# Patient Record
Sex: Female | Born: 1945 | Race: White | Hispanic: No | Marital: Married | Smoking: Never smoker
Health system: Southern US, Community
[De-identification: ages and names within clinical notes are randomized; demographics above are authoritative.]

## PROBLEM LIST (undated history)

## (undated) DIAGNOSIS — E785 Hyperlipidemia, unspecified: Secondary | ICD-10-CM

## (undated) DIAGNOSIS — M8580 Other specified disorders of bone density and structure, unspecified site: Secondary | ICD-10-CM

## (undated) DIAGNOSIS — C50919 Malignant neoplasm of unspecified site of unspecified female breast: Secondary | ICD-10-CM

## (undated) DIAGNOSIS — M1990 Unspecified osteoarthritis, unspecified site: Secondary | ICD-10-CM

## (undated) DIAGNOSIS — N952 Postmenopausal atrophic vaginitis: Secondary | ICD-10-CM

## (undated) DIAGNOSIS — E559 Vitamin D deficiency, unspecified: Secondary | ICD-10-CM

## (undated) DIAGNOSIS — S62109A Fracture of unspecified carpal bone, unspecified wrist, initial encounter for closed fracture: Secondary | ICD-10-CM

## (undated) HISTORY — PX: PILONIDAL CYST EXCISION: SHX744

## (undated) HISTORY — PX: WISDOM TOOTH EXTRACTION: SHX21

## (undated) HISTORY — DX: Vitamin D deficiency, unspecified: E55.9

## (undated) HISTORY — DX: Hyperlipidemia, unspecified: E78.5

## (undated) HISTORY — PX: TONSILLECTOMY: SUR1361

## (undated) HISTORY — DX: Fracture of unspecified carpal bone, unspecified wrist, initial encounter for closed fracture: S62.109A

## (undated) HISTORY — PX: CATARACT EXTRACTION, BILATERAL: SHX1313

## (undated) HISTORY — DX: Postmenopausal atrophic vaginitis: N95.2

## (undated) HISTORY — DX: Malignant neoplasm of unspecified site of unspecified female breast: C50.919

## (undated) HISTORY — DX: Other specified disorders of bone density and structure, unspecified site: M85.80

---

## 1993-01-11 HISTORY — PX: BREAST CYST ASPIRATION: SHX578

## 1997-08-01 ENCOUNTER — Other Ambulatory Visit: Admission: RE | Admit: 1997-08-01 | Discharge: 1997-08-01 | Payer: Self-pay | Admitting: Obstetrics and Gynecology

## 1998-01-11 HISTORY — PX: BREAST BIOPSY: SHX20

## 1998-03-31 ENCOUNTER — Other Ambulatory Visit: Admission: RE | Admit: 1998-03-31 | Discharge: 1998-03-31 | Payer: Self-pay | Admitting: Radiology

## 1998-10-06 ENCOUNTER — Other Ambulatory Visit: Admission: RE | Admit: 1998-10-06 | Discharge: 1998-10-06 | Payer: Self-pay | Admitting: Obstetrics and Gynecology

## 1999-10-12 ENCOUNTER — Other Ambulatory Visit: Admission: RE | Admit: 1999-10-12 | Discharge: 1999-10-12 | Payer: Self-pay | Admitting: Obstetrics and Gynecology

## 2000-12-02 ENCOUNTER — Other Ambulatory Visit: Admission: RE | Admit: 2000-12-02 | Discharge: 2000-12-02 | Payer: Self-pay | Admitting: Obstetrics and Gynecology

## 2001-12-11 ENCOUNTER — Other Ambulatory Visit: Admission: RE | Admit: 2001-12-11 | Discharge: 2001-12-11 | Payer: Self-pay | Admitting: Obstetrics and Gynecology

## 2002-04-09 ENCOUNTER — Ambulatory Visit (HOSPITAL_COMMUNITY): Admission: RE | Admit: 2002-04-09 | Discharge: 2002-04-09 | Payer: Self-pay | Admitting: Gastroenterology

## 2003-03-04 ENCOUNTER — Other Ambulatory Visit: Admission: RE | Admit: 2003-03-04 | Discharge: 2003-03-04 | Payer: Self-pay | Admitting: Obstetrics and Gynecology

## 2003-07-10 ENCOUNTER — Encounter (HOSPITAL_COMMUNITY): Admission: RE | Admit: 2003-07-10 | Discharge: 2003-10-08 | Payer: Self-pay | Admitting: General Surgery

## 2003-07-12 ENCOUNTER — Encounter: Admission: RE | Admit: 2003-07-12 | Discharge: 2003-07-12 | Payer: Self-pay | Admitting: General Surgery

## 2003-07-16 HISTORY — PX: BREAST LUMPECTOMY: SHX2

## 2003-07-16 HISTORY — PX: SENTINEL LYMPH NODE BIOPSY: SHX2392

## 2003-07-17 ENCOUNTER — Ambulatory Visit (HOSPITAL_BASED_OUTPATIENT_CLINIC_OR_DEPARTMENT_OTHER): Admission: RE | Admit: 2003-07-17 | Discharge: 2003-07-17 | Payer: Self-pay | Admitting: General Surgery

## 2003-07-17 ENCOUNTER — Ambulatory Visit (HOSPITAL_COMMUNITY): Admission: RE | Admit: 2003-07-17 | Discharge: 2003-07-17 | Payer: Self-pay | Admitting: General Surgery

## 2003-07-17 ENCOUNTER — Encounter (INDEPENDENT_AMBULATORY_CARE_PROVIDER_SITE_OTHER): Payer: Self-pay | Admitting: Specialist

## 2003-08-12 ENCOUNTER — Ambulatory Visit: Admission: RE | Admit: 2003-08-12 | Discharge: 2003-09-25 | Payer: Self-pay | Admitting: *Deleted

## 2003-08-14 ENCOUNTER — Ambulatory Visit (HOSPITAL_COMMUNITY): Admission: RE | Admit: 2003-08-14 | Discharge: 2003-08-14 | Payer: Self-pay | Admitting: Oncology

## 2003-11-22 ENCOUNTER — Ambulatory Visit: Payer: Self-pay | Admitting: Oncology

## 2004-01-03 ENCOUNTER — Ambulatory Visit: Admission: RE | Admit: 2004-01-03 | Discharge: 2004-03-06 | Payer: Self-pay | Admitting: *Deleted

## 2004-01-17 ENCOUNTER — Ambulatory Visit: Payer: Self-pay | Admitting: Oncology

## 2004-03-12 ENCOUNTER — Ambulatory Visit: Payer: Self-pay | Admitting: Oncology

## 2004-03-17 ENCOUNTER — Other Ambulatory Visit: Admission: RE | Admit: 2004-03-17 | Discharge: 2004-03-17 | Payer: Self-pay | Admitting: Obstetrics and Gynecology

## 2004-04-01 ENCOUNTER — Ambulatory Visit: Admission: RE | Admit: 2004-04-01 | Discharge: 2004-04-01 | Payer: Self-pay | Admitting: *Deleted

## 2004-07-16 ENCOUNTER — Ambulatory Visit: Payer: Self-pay | Admitting: Oncology

## 2004-11-03 ENCOUNTER — Ambulatory Visit: Payer: Self-pay | Admitting: Oncology

## 2005-01-13 ENCOUNTER — Ambulatory Visit: Payer: Self-pay | Admitting: Oncology

## 2005-04-06 ENCOUNTER — Other Ambulatory Visit: Admission: RE | Admit: 2005-04-06 | Discharge: 2005-04-06 | Payer: Self-pay | Admitting: Obstetrics & Gynecology

## 2005-06-21 ENCOUNTER — Ambulatory Visit: Payer: Self-pay | Admitting: Internal Medicine

## 2005-07-20 ENCOUNTER — Ambulatory Visit: Payer: Self-pay | Admitting: Oncology

## 2005-07-20 LAB — CBC WITH DIFFERENTIAL/PLATELET
BASO%: 1.3 % (ref 0.0–2.0)
EOS%: 1.5 % (ref 0.0–7.0)
HCT: 37.9 % (ref 34.8–46.6)
HGB: 13.1 g/dL (ref 11.6–15.9)
MCH: 31.8 pg (ref 26.0–34.0)
MCHC: 34.7 g/dL (ref 32.0–36.0)
MCV: 91.7 fL (ref 81.0–101.0)
MONO#: 0.4 10*3/uL (ref 0.1–0.9)
NEUT#: 2.1 10*3/uL (ref 1.5–6.5)
RDW: 13.2 % (ref 11.3–14.5)

## 2005-07-20 LAB — LACTATE DEHYDROGENASE: LDH: 138 U/L (ref 94–250)

## 2005-07-20 LAB — COMPREHENSIVE METABOLIC PANEL
Albumin: 4.7 g/dL (ref 3.5–5.2)
Alkaline Phosphatase: 56 U/L (ref 39–117)
BUN: 15 mg/dL (ref 6–23)
CO2: 27 mEq/L (ref 19–32)
Chloride: 104 mEq/L (ref 96–112)
Glucose, Bld: 91 mg/dL (ref 70–99)
Total Protein: 7.1 g/dL (ref 6.0–8.3)

## 2006-01-19 ENCOUNTER — Ambulatory Visit: Payer: Self-pay | Admitting: Oncology

## 2006-01-24 LAB — COMPREHENSIVE METABOLIC PANEL
ALT: 14 U/L (ref 0–35)
AST: 17 U/L (ref 0–37)
Albumin: 4.8 g/dL (ref 3.5–5.2)
Alkaline Phosphatase: 50 U/L (ref 39–117)
BUN: 16 mg/dL (ref 6–23)
Calcium: 9.7 mg/dL (ref 8.4–10.5)
Chloride: 107 mEq/L (ref 96–112)
Potassium: 4 mEq/L (ref 3.5–5.3)

## 2006-01-24 LAB — CBC WITH DIFFERENTIAL/PLATELET
BASO%: 0.8 % (ref 0.0–2.0)
EOS%: 0.7 % (ref 0.0–7.0)
MCH: 31.3 pg (ref 26.0–34.0)
MCHC: 33.5 g/dL (ref 32.0–36.0)
MCV: 93.2 fL (ref 81.0–101.0)
MONO%: 6.6 % (ref 0.0–13.0)
RBC: 3.84 10*6/uL (ref 3.70–5.32)
RDW: 13.1 % (ref 11.3–14.5)
lymph#: 2.5 10*3/uL (ref 0.9–3.3)

## 2006-04-18 ENCOUNTER — Other Ambulatory Visit: Admission: RE | Admit: 2006-04-18 | Discharge: 2006-04-18 | Payer: Self-pay | Admitting: Obstetrics and Gynecology

## 2006-06-30 ENCOUNTER — Ambulatory Visit (HOSPITAL_COMMUNITY): Admission: RE | Admit: 2006-06-30 | Discharge: 2006-06-30 | Payer: Self-pay | Admitting: Diagnostic Radiology

## 2006-07-04 ENCOUNTER — Ambulatory Visit (HOSPITAL_COMMUNITY): Admission: RE | Admit: 2006-07-04 | Discharge: 2006-07-04 | Payer: Self-pay | Admitting: Oncology

## 2006-07-11 ENCOUNTER — Ambulatory Visit: Payer: Self-pay | Admitting: Internal Medicine

## 2006-07-18 ENCOUNTER — Ambulatory Visit: Payer: Self-pay | Admitting: Oncology

## 2006-07-21 LAB — CBC WITH DIFFERENTIAL/PLATELET
Basophils Absolute: 0 10*3/uL (ref 0.0–0.1)
Eosinophils Absolute: 0 10*3/uL (ref 0.0–0.5)
HGB: 11.7 g/dL (ref 11.6–15.9)
MCV: 91.1 fL (ref 81.0–101.0)
MONO#: 0.3 10*3/uL (ref 0.1–0.9)
MONO%: 5.5 % (ref 0.0–13.0)
NEUT#: 2.3 10*3/uL (ref 1.5–6.5)
RDW: 13.3 % (ref 11.3–14.5)
WBC: 4.8 10*3/uL (ref 3.9–10.0)

## 2006-07-21 LAB — COMPREHENSIVE METABOLIC PANEL
Albumin: 4.5 g/dL (ref 3.5–5.2)
Alkaline Phosphatase: 40 U/L (ref 39–117)
BUN: 13 mg/dL (ref 6–23)
CO2: 25 mEq/L (ref 19–32)
Calcium: 9.2 mg/dL (ref 8.4–10.5)
Chloride: 108 mEq/L (ref 96–112)
Glucose, Bld: 163 mg/dL — ABNORMAL HIGH (ref 70–99)
Potassium: 3.7 mEq/L (ref 3.5–5.3)
Sodium: 140 mEq/L (ref 135–145)
Total Protein: 6.8 g/dL (ref 6.0–8.3)

## 2006-07-21 LAB — CANCER ANTIGEN 27.29: CA 27.29: 19 U/mL (ref 0–39)

## 2006-07-21 LAB — LACTATE DEHYDROGENASE: LDH: 135 U/L (ref 94–250)

## 2006-09-06 DIAGNOSIS — Z853 Personal history of malignant neoplasm of breast: Secondary | ICD-10-CM

## 2006-10-14 ENCOUNTER — Telehealth (INDEPENDENT_AMBULATORY_CARE_PROVIDER_SITE_OTHER): Payer: Self-pay | Admitting: *Deleted

## 2006-11-18 ENCOUNTER — Ambulatory Visit: Payer: Self-pay | Admitting: Internal Medicine

## 2007-01-23 ENCOUNTER — Ambulatory Visit: Payer: Self-pay | Admitting: Oncology

## 2007-01-25 LAB — COMPREHENSIVE METABOLIC PANEL
ALT: 13 U/L (ref 0–35)
AST: 17 U/L (ref 0–37)
CO2: 26 mEq/L (ref 19–32)
Calcium: 9.8 mg/dL (ref 8.4–10.5)
Chloride: 107 mEq/L (ref 96–112)
Creatinine, Ser: 0.79 mg/dL (ref 0.40–1.20)
Sodium: 143 mEq/L (ref 135–145)
Total Bilirubin: 0.4 mg/dL (ref 0.3–1.2)
Total Protein: 7.2 g/dL (ref 6.0–8.3)

## 2007-01-25 LAB — CBC WITH DIFFERENTIAL/PLATELET
BASO%: 1.2 % (ref 0.0–2.0)
EOS%: 1.1 % (ref 0.0–7.0)
MCH: 31.9 pg (ref 26.0–34.0)
MCHC: 35.2 g/dL (ref 32.0–36.0)
MONO#: 0.3 10*3/uL (ref 0.1–0.9)
NEUT%: 43.1 % (ref 39.6–76.8)
RBC: 4.13 10*6/uL (ref 3.70–5.32)
WBC: 4.9 10*3/uL (ref 3.9–10.0)
lymph#: 2.4 10*3/uL (ref 0.9–3.3)

## 2007-01-25 LAB — LACTATE DEHYDROGENASE: LDH: 135 U/L (ref 94–250)

## 2007-05-08 ENCOUNTER — Other Ambulatory Visit: Admission: RE | Admit: 2007-05-08 | Discharge: 2007-05-08 | Payer: Self-pay | Admitting: Obstetrics & Gynecology

## 2007-06-28 ENCOUNTER — Encounter: Payer: Self-pay | Admitting: Internal Medicine

## 2007-08-14 ENCOUNTER — Telehealth: Payer: Self-pay | Admitting: *Deleted

## 2007-10-19 ENCOUNTER — Ambulatory Visit: Payer: Self-pay | Admitting: Internal Medicine

## 2008-01-09 ENCOUNTER — Ambulatory Visit: Payer: Self-pay | Admitting: Internal Medicine

## 2008-01-09 DIAGNOSIS — S99929A Unspecified injury of unspecified foot, initial encounter: Secondary | ICD-10-CM

## 2008-01-09 DIAGNOSIS — S8990XA Unspecified injury of unspecified lower leg, initial encounter: Secondary | ICD-10-CM

## 2008-01-09 DIAGNOSIS — S2341XA Sprain of ribs, initial encounter: Secondary | ICD-10-CM

## 2008-01-09 DIAGNOSIS — S99919A Unspecified injury of unspecified ankle, initial encounter: Secondary | ICD-10-CM

## 2008-01-23 ENCOUNTER — Ambulatory Visit: Payer: Self-pay | Admitting: Oncology

## 2008-01-25 LAB — CBC WITH DIFFERENTIAL/PLATELET
Basophils Absolute: 0 10*3/uL (ref 0.0–0.1)
EOS%: 0.8 % (ref 0.0–7.0)
HCT: 37.7 % (ref 34.8–46.6)
HGB: 13 g/dL (ref 11.6–15.9)
MONO%: 6.5 % (ref 0.0–13.0)
NEUT#: 2.3 10*3/uL (ref 1.5–6.5)
NEUT%: 48.7 % (ref 39.6–76.8)
Platelets: 207 10*3/uL (ref 145–400)

## 2008-01-26 LAB — COMPREHENSIVE METABOLIC PANEL
ALT: 13 U/L (ref 0–35)
AST: 14 U/L (ref 0–37)
Albumin: 4.6 g/dL (ref 3.5–5.2)
Alkaline Phosphatase: 58 U/L (ref 39–117)
BUN: 14 mg/dL (ref 6–23)
Calcium: 9.3 mg/dL (ref 8.4–10.5)
Total Bilirubin: 0.5 mg/dL (ref 0.3–1.2)

## 2008-01-26 LAB — LACTATE DEHYDROGENASE: LDH: 130 U/L (ref 94–250)

## 2008-03-28 ENCOUNTER — Encounter: Payer: Self-pay | Admitting: Internal Medicine

## 2008-05-06 ENCOUNTER — Encounter: Payer: Self-pay | Admitting: Internal Medicine

## 2008-07-01 ENCOUNTER — Encounter: Payer: Self-pay | Admitting: Internal Medicine

## 2008-07-02 ENCOUNTER — Ambulatory Visit: Payer: Self-pay | Admitting: Oncology

## 2008-11-25 ENCOUNTER — Ambulatory Visit: Payer: Self-pay | Admitting: Internal Medicine

## 2009-02-03 ENCOUNTER — Ambulatory Visit: Payer: Self-pay | Admitting: Oncology

## 2009-02-05 LAB — CBC WITH DIFFERENTIAL/PLATELET
Basophils Absolute: 0.1 10*3/uL (ref 0.0–0.1)
EOS%: 0.8 % (ref 0.0–7.0)
Eosinophils Absolute: 0.1 10*3/uL (ref 0.0–0.5)
HCT: 38.2 % (ref 34.8–46.6)
MONO#: 0.3 10*3/uL (ref 0.1–0.9)
MONO%: 4.3 % (ref 0.0–14.0)
NEUT#: 4.6 10*3/uL (ref 1.5–6.5)
NEUT%: 58.9 % (ref 38.4–76.8)
Platelets: 262 10*3/uL (ref 145–400)
RBC: 4.08 10*6/uL (ref 3.70–5.45)
WBC: 7.8 10*3/uL (ref 3.9–10.3)

## 2009-02-05 LAB — COMPREHENSIVE METABOLIC PANEL
AST: 18 U/L (ref 0–37)
Albumin: 4.9 g/dL (ref 3.5–5.2)
Alkaline Phosphatase: 50 U/L (ref 39–117)
CO2: 27 mEq/L (ref 19–32)
Glucose, Bld: 146 mg/dL — ABNORMAL HIGH (ref 70–99)
Total Bilirubin: 0.3 mg/dL (ref 0.3–1.2)

## 2009-09-23 LAB — HM PAP SMEAR

## 2009-10-01 ENCOUNTER — Ambulatory Visit: Payer: Self-pay | Admitting: Internal Medicine

## 2010-02-02 ENCOUNTER — Encounter: Payer: Self-pay | Admitting: Oncology

## 2010-02-12 NOTE — Assessment & Plan Note (Signed)
Summary: flu shot//ccm  Nurse Visit   Vitals Entered By: Duard Brady LPN (October 01, 2009 4:08 PM)  Allergies: No Known Drug Allergies  Orders Added: 1)  Admin 1st Vaccine [90471] 2)  Flu Vaccine 66yrs + [04540] Flu Vaccine Consent Questions     Do you have a history of severe allergic reactions to this vaccine? no    Any prior history of allergic reactions to egg and/or gelatin? no    Do you have a sensitivity to the preservative Thimersol? no    Do you have a past history of Guillan-Barre Syndrome? no    Do you currently have an acute febrile illness? no    Have you ever had a severe reaction to latex? no    Vaccine information given and explained to patient? yes    Are you currently pregnant? no    Lot Number:[id number]   Exp Date:07/11/2010   Site Given  Left Deltoid IM .lbflu

## 2010-03-19 ENCOUNTER — Encounter (HOSPITAL_BASED_OUTPATIENT_CLINIC_OR_DEPARTMENT_OTHER): Payer: BC Managed Care – PPO | Admitting: Oncology

## 2010-03-19 ENCOUNTER — Other Ambulatory Visit: Payer: Self-pay | Admitting: Oncology

## 2010-03-19 DIAGNOSIS — C50319 Malignant neoplasm of lower-inner quadrant of unspecified female breast: Secondary | ICD-10-CM

## 2010-03-19 DIAGNOSIS — Z170 Estrogen receptor positive status [ER+]: Secondary | ICD-10-CM

## 2010-03-19 DIAGNOSIS — M899 Disorder of bone, unspecified: Secondary | ICD-10-CM

## 2010-03-19 LAB — CBC WITH DIFFERENTIAL/PLATELET
BASO%: 1.1 % (ref 0.0–2.0)
HGB: 12.6 g/dL (ref 11.6–15.9)
MCH: 30.7 pg (ref 25.1–34.0)
MCV: 90 fL (ref 79.5–101.0)
MONO#: 0.3 10*3/uL (ref 0.1–0.9)
Platelets: 175 10*3/uL (ref 145–400)
RBC: 4.1 10*6/uL (ref 3.70–5.45)

## 2010-03-20 LAB — CANCER ANTIGEN 27.29: CA 27.29: 29 U/mL (ref 0–39)

## 2010-03-20 LAB — COMPREHENSIVE METABOLIC PANEL
ALT: 15 U/L (ref 0–35)
AST: 20 U/L (ref 0–37)
BUN: 13 mg/dL (ref 6–23)
CO2: 24 mEq/L (ref 19–32)
Chloride: 103 mEq/L (ref 96–112)
Glucose, Bld: 121 mg/dL — ABNORMAL HIGH (ref 70–99)
Potassium: 4.4 mEq/L (ref 3.5–5.3)
Sodium: 138 mEq/L (ref 135–145)

## 2010-03-20 LAB — LACTATE DEHYDROGENASE: LDH: 129 U/L (ref 94–250)

## 2010-05-29 NOTE — Op Note (Signed)
NAME:  Diana Martin, Diana Martin                        ACCOUNT NO.:  [id number]   MEDICAL RECORD NO.:  [id number]                   PATIENT TYPE:  AMB   LOCATION:  DSC                                  FACILITY:  [facility]   PHYSICIAN:  Sharlet Salina T. Hoxworth, M.D.          DATE OF BIRTH:  08-Aug-1945   DATE OF PROCEDURE:  07/17/2003  DATE OF DISCHARGE:                                 OPERATIVE REPORT   PREOPERATIVE DIAGNOSIS:  Carcinoma of the left breast.   POSTOPERATIVE DIAGNOSIS:  Carcinoma of the left breast.   PROCEDURE:  1. Blue dye injection, left breast.  2. Left axillary sentinel lymph node biopsy.  3. Left breast needle localized lumpectomy.   SURGEON:  Lorne Skeens. Hoxworth, M.D.   ANESTHESIA:  LMA general.   BRIEF HISTORY:  [name] is a 65 year old white female who, on a recent  screening mammogram, was found to have a new 1 cm mass in the medial aspect  of the left breast.  Large core needle biopsy has revealed a well  differentiated ductal carcinoma with tubular features.  After a discussion  of the initial surgical treatment options, we have elected to proceed with  left axillary sentinel lymph node biopsy with possible axillary dissection  and needle localized left lumpectomy.  The nature of the procedure,  indications, risks of bleeding, infection, possible need for further surgery  based on the pathology findings were discussed and understood.  The patient  is now brought to the operating room for this procedure.   DESCRIPTION OF PROCEDURE:  The patient was brought to the operating room and  placed in supine position on the operating table and laryngeal mask general  anesthesia was induced.  She had previously undergone needle localization at  the [facility] and had undergone injection of 1 millicurie of  technetium sulfa colloid in nuclear medicine.  10 mL of Lymphazurin blue was  injected subareolarly and massaged for several minutes.  The left chest,  arm, and axilla were sterilely prepped and draped.  The Neoprobe was used to  identify a definite hot area in the left axilla.  A small transverse  incision was made and dissection was carried down to the subcutaneous  tissues with the cautery.  The axilla proper was entered with blunt  dissection and immediately identified was a bright blue lymph node.  This  was elevated up with a couple of other surrounding smaller nodes that had  some radioactive activity, as well, and this small block of nodes was  excised with cautery.  Counts of the sentinel node were in excess of 3000  with backgrounds in the axilla no greater than 250.  This was sent as  sentinel lymph node.  While awaiting this, we proceeded with the lumpectomy.  A curvilinear incision was made in the medial aspect of the breast over the  area of the tumor and dissection was carried down into the subcutaneous  tissue.  The wire was brought into the incision from its insertion site.  A  generous specimen of breast tissue was then excised around the shaft and the  tip of the wire working medially.  This was a thin area of the breast  medially but the specimen showed no abnormal tissue around its periphery and  a small firm area could be felt in the center of the specimen which appeared  to be well excised.  The deep margin included a portion of the pectoralis  fascia and the superficial margin was at the subcutaneous tissue.  The  specimen was oriented and sent for specimen mammography.  The sentinel node  report returned as negative for cancer.  The specimen mammography showed the  lesion well within the specimen.  Each wound was infiltrated with Marcaine  with epinephrine.  Hemostasis was obtained with cautery.  The wound was  closed with interrupted subcutaneous 3-0 Vicryl and running subcuticular 5-0  Monocryl and Steri-Strips.  Sponge, needle, and instrument counts were  correct.  Dry, sterile dressings were applied.  The  patient was taken to the  recovery room in good condition.                                               Lorne Skeens. Hoxworth, M.D.    Tory Emerald  D:  07/17/2003  T:  07/17/2003  Job:  [id number]

## 2010-05-29 NOTE — Op Note (Signed)
   NAME:  Diana Martin, Diana Martin                        ACCOUNT NO.:  [id number]   MEDICAL RECORD NO.:  [id number]                   PATIENT TYPE:  AMB   LOCATION:  ENDO                                 FACILITY:  [facility]   PHYSICIAN:  Anselmo Rod, M.D.               DATE OF BIRTH:  12-Jan-1945   DATE OF PROCEDURE:  04/09/2002  DATE OF DISCHARGE:                                 OPERATIVE REPORT   PROCEDURE:  Colonoscopy.   ENDOSCOPIST:  Charna Elizabeth, M.D.   INSTRUMENT USED:  Olympus video colonoscope.   INDICATIONS FOR PROCEDURE:  [age]-year-old white female with a history  of change in bowel habits and rectal bleeding.  Rule out colonic polyps,  masses, hemorrhoids, etc.   PROCEDURE PERFORMED:  Informed consent was procured from the patient.  The  patient fasted for eight hours prior to the procedure and prepped with a  bottle of magnesium citrate and a gallon of GoLYTELY the night prior to the  procedure.   PREPROCEDURE PHYSICAL EXAMINATION:  VITAL SIGNS:  The patient had stable  vital signs.  NECK:  Supple.  CHEST:  Clear to auscultation.  HEART:  S1 and S2 regular.  ABDOMEN:  Soft with normal bowel sounds.   DESCRIPTION OF PROCEDURE:  The patient was placed in the left lateral  decubitus position, sedated with 70 mg of Demerol and 7 mg of Versed  intravenously.  Once the patient was adequately sedated and maintained on  low flow oxygen, continuous cardiac monitoring, the Olympus video  colonoscope was advanced from the rectum to the cecum and terminal ileum  without difficulty. The patient had a fairly good prep.  Small internal  hemorrhoids were seen on retroflexion.  No masses, polyps, erosions, ulcers,  or diverticula.  On retroflexion, the rectum revealed no other abnormalities  except for the hemorrhoids mentioned above.   IMPRESSION:  Normal colonoscopy of the terminal ileum except for a small  nonbleeding internal hemorrhoid.   RECOMMENDATIONS:  1. High-fiber  diet with liberal fluid intake with __________  has been     recommended in the diet.  2.     Repeat colorectal screening is recommended in the next five years unless the     patient develops any abnormal symptoms in the interim.  3. Outpatient followup on a p.r.n. basis.                                               Anselmo Rod, M.D.    JNM/MEDQ  D:  04/09/2002  T:  04/09/2002  Job:  [id number]   cc:   Laqueta Linden, M.D.  [address]  [place]  [place] [address]  Fax: [phone]

## 2010-10-28 LAB — CREATININE, SERUM
Creatinine, Ser: 0.75
GFR calc Af Amer: >60
GFR calc non Af Amer: >60

## 2011-03-19 ENCOUNTER — Encounter: Payer: Self-pay | Admitting: Physician Assistant

## 2011-03-19 ENCOUNTER — Other Ambulatory Visit (HOSPITAL_BASED_OUTPATIENT_CLINIC_OR_DEPARTMENT_OTHER): Payer: Medicare Other | Admitting: Lab

## 2011-03-19 ENCOUNTER — Telehealth: Payer: Self-pay | Admitting: *Deleted

## 2011-03-19 ENCOUNTER — Ambulatory Visit (HOSPITAL_BASED_OUTPATIENT_CLINIC_OR_DEPARTMENT_OTHER): Payer: Medicare Other | Admitting: Physician Assistant

## 2011-03-19 DIAGNOSIS — C50319 Malignant neoplasm of lower-inner quadrant of unspecified female breast: Secondary | ICD-10-CM

## 2011-03-19 DIAGNOSIS — Z170 Estrogen receptor positive status [ER+]: Secondary | ICD-10-CM

## 2011-03-19 DIAGNOSIS — C50919 Malignant neoplasm of unspecified site of unspecified female breast: Secondary | ICD-10-CM

## 2011-03-19 DIAGNOSIS — E559 Vitamin D deficiency, unspecified: Secondary | ICD-10-CM

## 2011-03-19 DIAGNOSIS — M949 Disorder of cartilage, unspecified: Secondary | ICD-10-CM

## 2011-03-19 LAB — CBC WITH DIFFERENTIAL/PLATELET
Basophils Absolute: 0 10*3/uL (ref 0.0–0.1)
EOS%: 1.1 % (ref 0.0–7.0)
HCT: 37.5 % (ref 34.8–46.6)
HGB: 12.6 g/dL (ref 11.6–15.9)
MCH: 31.3 pg (ref 25.1–34.0)
NEUT%: 52.8 % (ref 38.4–76.8)
lymph#: 2.4 10*3/uL (ref 0.9–3.3)

## 2011-03-19 NOTE — Progress Notes (Signed)
Hematology and Oncology Follow Up Visit  Diana Martin [id number] 08-22-1945 66 y.o. 03/19/2011    HPI: Diana Martin is a 66 year old [place] woman with a history of a T1 C., N0 left breast carcinoma for which she underwent a left lumpectomy with sentinel node dissection for an ER/PR positive lesion. She completed 6 cycles of adjuvant CMF, followed by radiation therapy which was completed February of 2006. She then participated in MA27 trial and was randomized to the rib fracture arm. She completed 5 years worth of Arimidex in December 2010. On observation alone.  Interim History:   Diana Martin is seen today for routine annual follow pertain to her history of a stage I ER/PR positive left breast carcinoma. She is feeling great, volunteering on Wednesdays here at the [facility].  She denies any fevers, chills, night sweats, no shortness of breath or chest pain. No nausea, emesis, diarrhea, or constipation issues. She continues on Fosamax 35 mg weekly. She does weight bearing exercise. She will be due for her annual screening mammograms and biannual bone density both performed at [facility] in July 2013. A detailed review of systems is otherwise noncontributory as noted below.  Review of Systems: Constitutional:  no weight loss, fever, night sweats and feels well Eyes: uses glasses ENT: no complaints Cardiovascular: no chest pain or dyspnea on exertion Respiratory: no cough, shortness of breath, or wheezing Neurological: no TIA or stroke symptoms Dermatological: negative Gastrointestinal: no abdominal pain, change in bowel habits, or black or bloody stools Genito-Urinary: no dysuria, trouble voiding, or hematuria Hematological and Lymphatic: negative Breast: negative Musculoskeletal: negative Remaining ROS negative.  Medications:   I have reviewed the patient's current medications.  Current Outpatient Prescriptions  Medication Sig Dispense Refill  . alendronate (FOSAMAX) 35  MG tablet Take 35 mg by mouth every 7 (seven) days. Take with a full glass of water on an empty stomach.      . calcium-vitamin D (OSCAL WITH D) 500-200 MG-UNIT per tablet Take 1 tablet by mouth daily.      Marland Kitchen estradiol (VAGIFEM) 25 MCG vaginal tablet Place 25 mcg vaginally once a week.        Allergies: No Known Allergies  Physical Exam: Filed Vitals:   03/19/11 1315  BP: 129/74  Pulse: 82  Temp: 98.7 F (37.1 C)    Body mass index is 21.33 kg/(m^2). Weight: 128 lbs. HEENT:  Sclerae anicteric, conjunctivae pink.  Oropharynx clear.  No mucositis or candidiasis.   Nodes:  No cervical, supraclavicular, or axillary lymphadenopathy palpated.  Breast Exam: Her left breast was examined, telangiectasias are still present, the lumpectomy scar in the medial aspect is essentially benign no dominant mass effect was appreciated. No nipple inversion or discharge axilla is clear. The right breast is free of masses skin changes nipple inversion or discharge.  Lungs:  Clear to auscultation bilaterally.  No crackles, rhonchi, or wheezes.   Heart:  Regular rate and rhythm.   Abdomen:  Soft, nontender.  Positive bowel sounds.  No organomegaly or masses palpated.   Musculoskeletal:  No focal spinal tenderness to palpation.  Extremities:  Benign.  No peripheral edema or cyanosis.   Skin:  Benign.   Neuro:  Nonfocal, alert and oriented x 3.   Lab Results: Lab Results  Component Value Date   WBC 6.2 03/19/2011   HGB 12.6 03/19/2011   HCT 37.5 03/19/2011   MCV 93.1 03/19/2011   PLT 211 03/19/2011   NEUTROABS 3.3 03/19/2011  Chemistry      Component Value Date/Time   NA 138 03/19/2010 1015   NA 138 03/19/2010 1015   NA 138 03/19/2010 1015   K 4.4 03/19/2010 1015   K 4.4 03/19/2010 1015   K 4.4 03/19/2010 1015   CL 103 03/19/2010 1015   CL 103 03/19/2010 1015   CL 103 03/19/2010 1015   CO2 24 03/19/2010 1015   CO2 24 03/19/2010 1015   CO2 24 03/19/2010 1015   BUN 13 03/19/2010 1015   BUN 13 03/19/2010 1015   BUN 13 03/19/2010  1015   CREATININE 0.84 03/19/2010 1015   CREATININE 0.84 03/19/2010 1015   CREATININE 0.84 03/19/2010 1015      Component Value Date/Time   CALCIUM 9.2 03/19/2010 1015   CALCIUM 9.2 03/19/2010 1015   CALCIUM 9.2 03/19/2010 1015   ALKPHOS 43 03/19/2010 1015   ALKPHOS 43 03/19/2010 1015   ALKPHOS 43 03/19/2010 1015   AST 20 03/19/2010 1015   AST 20 03/19/2010 1015   AST 20 03/19/2010 1015   ALT 15 03/19/2010 1015   ALT 15 03/19/2010 1015   ALT 15 03/19/2010 1015   BILITOT 0.4 03/19/2010 1015   BILITOT 0.4 03/19/2010 1015   BILITOT 0.4 03/19/2010 1015      Lab Results  Component Value Date   LABCA2 29 03/19/2010   LABCA2 29 03/19/2010     Assessment:  Diana Martin is a 66 year old [place] woman with a history of a T1 C., N0 left breast carcinoma for which she underwent a left lumpectomy with sentinel node dissection for an ER/PR positive lesion. She completed 6 cycles of adjuvant CMF, followed by radiation therapy which was completed February of 2006. She then participated in MA27 trial and was randomized to the rib fracture arm. She completed 5 years worth of Arimidex in December 2010. On observation alone. No evidence of recurrent or metastatic disease.  Case has been reviewed with Dr. Pierce Crane.  Plan:  Diana Martin will be scheduled for her annual screening mammograms and biannual bone density in July of 2013, these will be performed at [facility]. We will see her back in one year for her annual followup to include a CBC, serum chemistry, LDH, vitamin D level, and a CA 27-29. She knows we would be happy to see her prior to her annual followup if the need should arise.  This plan was reviewed with the patient, who voices understanding and agreement.  She knows to call with any changes or problems.    Milas Schappell T, PA-C 03/19/2011

## 2011-03-19 NOTE — Telephone Encounter (Signed)
gave patient appointment for 03-2012 per patient request she will contact [facility] for her mammogram and bone density appointment

## 2011-03-20 LAB — COMPREHENSIVE METABOLIC PANEL
ALT: 14 U/L (ref 0–35)
CO2: 26 mEq/L (ref 19–32)
Calcium: 9.3 mg/dL (ref 8.4–10.5)
Chloride: 101 mEq/L (ref 96–112)
Creatinine, Ser: 0.84 mg/dL (ref 0.50–1.10)
Glucose, Bld: 116 mg/dL — ABNORMAL HIGH (ref 70–99)
Total Protein: 7 g/dL (ref 6.0–8.3)

## 2011-03-20 LAB — LACTATE DEHYDROGENASE: LDH: 137 U/L (ref 94–250)

## 2011-03-20 LAB — VITAMIN D 25 HYDROXY (VIT D DEFICIENCY, FRACTURES): Vit D, 25-Hydroxy: 53 ng/mL (ref 30–89)

## 2011-03-20 LAB — CANCER ANTIGEN 27.29: CA 27.29: 22 U/mL (ref 0–39)

## 2011-05-12 ENCOUNTER — Other Ambulatory Visit: Payer: Self-pay | Admitting: Oncology

## 2011-05-12 DIAGNOSIS — Z853 Personal history of malignant neoplasm of breast: Secondary | ICD-10-CM

## 2011-07-08 ENCOUNTER — Telehealth: Payer: Self-pay | Admitting: Medical Oncology

## 2011-07-08 LAB — HM DEXA SCAN

## 2011-07-08 NOTE — Telephone Encounter (Signed)
Diana Martin at [place] [facility] needs order for dexascan - ordered in chart per Irena Cords notified /

## 2011-12-10 ENCOUNTER — Telehealth: Payer: Self-pay | Admitting: *Deleted

## 2011-12-10 NOTE — Telephone Encounter (Signed)
Mailed out calendar to inform the patient of the new date and time do to the md on vac

## 2012-02-18 ENCOUNTER — Telehealth: Payer: Self-pay | Admitting: *Deleted

## 2012-02-18 NOTE — Telephone Encounter (Signed)
Pt request Dr. Welton Flakes.  Confirmed new appt date and time with Larina Bras, NP on 03/24/12 at 1:00pm.

## 2012-02-19 ENCOUNTER — Encounter: Payer: Self-pay | Admitting: Oncology

## 2012-03-24 ENCOUNTER — Encounter: Payer: Self-pay | Admitting: Family

## 2012-03-24 ENCOUNTER — Ambulatory Visit (HOSPITAL_BASED_OUTPATIENT_CLINIC_OR_DEPARTMENT_OTHER): Payer: Medicare Other | Admitting: Family

## 2012-03-24 ENCOUNTER — Other Ambulatory Visit: Payer: Medicare Other | Admitting: Lab

## 2012-03-24 ENCOUNTER — Telehealth: Payer: Self-pay | Admitting: Oncology

## 2012-03-24 ENCOUNTER — Ambulatory Visit: Payer: Medicare Other | Admitting: Oncology

## 2012-03-24 ENCOUNTER — Ambulatory Visit (HOSPITAL_BASED_OUTPATIENT_CLINIC_OR_DEPARTMENT_OTHER): Payer: Medicare Other

## 2012-03-24 VITALS — BP 137/80 | HR 108 | Temp 96.8°F | Resp 20 | Ht 65.0 in | Wt 123.1 lb

## 2012-03-24 DIAGNOSIS — N899 Noninflammatory disorder of vagina, unspecified: Secondary | ICD-10-CM

## 2012-03-24 DIAGNOSIS — Z853 Personal history of malignant neoplasm of breast: Secondary | ICD-10-CM

## 2012-03-24 DIAGNOSIS — M949 Disorder of cartilage, unspecified: Secondary | ICD-10-CM

## 2012-03-24 DIAGNOSIS — M899 Disorder of bone, unspecified: Secondary | ICD-10-CM

## 2012-03-24 LAB — CBC WITH DIFFERENTIAL/PLATELET
BASO%: 1.3 % (ref 0.0–2.0)
Eosinophils Absolute: 0 10*3/uL (ref 0.0–0.5)
HCT: 36.9 % (ref 34.8–46.6)
LYMPH%: 41.6 % (ref 14.0–49.7)
MCHC: 33.3 g/dL (ref 31.5–36.0)
MCV: 92.1 fL (ref 79.5–101.0)
MONO#: 0.3 10*3/uL (ref 0.1–0.9)
MONO%: 6.3 % (ref 0.0–14.0)
NEUT%: 50.3 % (ref 38.4–76.8)
Platelets: 184 10*3/uL (ref 145–400)
RBC: 4.01 10*6/uL (ref 3.70–5.45)
WBC: 5.2 10*3/uL (ref 3.9–10.3)

## 2012-03-24 LAB — COMPREHENSIVE METABOLIC PANEL (CC13)
BUN: 14.7 mg/dL (ref 7.0–26.0)
CO2: 25 mEq/L (ref 22–29)
Calcium: 9.8 mg/dL (ref 8.4–10.4)
Chloride: 105 mEq/L (ref 98–107)
Creatinine: 0.8 mg/dL (ref 0.6–1.1)
Total Bilirubin: 0.41 mg/dL (ref 0.20–1.20)

## 2012-03-24 NOTE — Progress Notes (Signed)
[facility]  [phone] [phone]  OFFICE PROGRESS NOTE  PATIENT: Diana Martin   DOB: [birth year]  MR#: [id number]  NGE#:[id number]   MW:NUUVOZ,DGUYQ KOTVAN, MD   DIAGNOSIS:  A 67 year old [place] woman with invasive ductal carcinoma of the left breast diagnosed in 06/2003.   PRIOR THERAPY: 1. Status post lumpectomy with sentinel node biopsy on 07/17/2003 for a stage I, T1c N0 Invasive ductal carcinoma of the left breast, grade 1, ER 95%, PR 92%, Ki- 67 3%, HER2/neu no amplification.  0/7 positive lymph nodes.  2. Status post adjuvant chemotherapy of CMF x 6 cycles from 08/16/2003 through 12/13/2003.  3. Status post radiation therapy from 01/16/2004 through 03/02/2004.  4.  Since 07/2004 the patient was a participant in the MA.27 randomized phase III trial of Exemestane versus Anastrozole in postmenopausal women with receptor positive primary breast cancer.  She was assigned to receive Anastrozole.  5. The patient completed five years of anti-estrogen therapy with Arimidex in 12/2008.  6. Bone density scan on 07/08/2011 showed a T score of -2.0 (osteopenia).   CURRENT THERAPY:  Annual mammography and observation.   INTERVAL HISTORY: Dr. Welton Flakes and I saw Diana Martin for follow-up of invasive duct carcinoma of the left breast today. The patient was last seen by Sharyl Nimrod, PA-C on 03/19/2011.  Since her last office visit, the patient states that she has continued vaginal dryness, night sweats and occasional constipation. The patient denies any other symptomatology.  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  PAST MEDICAL HISTORY: Past Medical History  Diagnosis Date  . Breast cancer     PAST SURGICAL HISTORY: Past Surgical History  Procedure Laterality Date  . Breast lumpectomy  07/16/2003    FAMILY HISTORY: Family History  Problem Relation Age of Onset  . Cancer Father 81    Lung cancer     SOCIAL HISTORY: History  Substance Use Topics  . Smoking status: Never Smoker   . Smokeless tobacco: Never Used  . Alcohol Use: Not on file    ALLERGIES: No Known Allergies   MEDICATIONS:  Current Outpatient Prescriptions  Medication Sig Dispense Refill  . alendronate (FOSAMAX) 35 MG tablet TAKE ONE TABLET WEEKLY WITH 6 TO 8 OZ OF WATER 30 MINUTES BEFORE FIRST FOOD, BEVERAGE, OR MEDICATION OF THE DAY. DO NOT LIE DOWN FOR AT LEAS  8 tablet  12  . calcium-vitamin D (OSCAL WITH D) 500-200 MG-UNIT per tablet Take 1 tablet by mouth daily.      Marland Kitchen estradiol (VAGIFEM) 25 MCG vaginal tablet Place 25 mcg vaginally once a week.       No current facility-administered medications for this visit.      REVIEW OF SYSTEMS: A 10 point review of systems was completed and is negative except as noted above.    PHYSICAL EXAMINATION: BP 137/80  Pulse 108  Temp(Src) 96.8 F (36 C) (Oral)  Resp 20  Ht 5\' 5"  (1.651 m)  Wt 123 lb 1.6 oz (55.838 kg)  BMI 20.48 kg/m2   General appearance: Alert, cooperative, well nourished, thin frame, no apparent distress Head: Normocephalic, without obvious abnormality, atraumatic Eyes: Arcus senilis, PERRLA, EOMI Nose: Nares, septum and mucosa are normal, no drainage or sinus tenderness Neck: No adenopathy, supple, symmetrical, trachea midline, thyroid not enlarged, no tenderness Resp: Clear to auscultation bilaterally Cardio: Regular rate and rhythm, S1, S2 normal, 2/6 systolic murmur, no click, rub or gallop Breasts: Soft bilaterally, left breast well-healed surgical scar,  left breast radiation changes noted, no lymphadenopathy, no nipple inversion, no axilla fullness, benign breast exam GI: Soft, not distended, non-tender, hypoactive bowel sounds, no organomegaly Extremities: Extremities normal, atraumatic, no cyanosis or edema Lymph nodes: Cervical, supraclavicular, and axillary nodes normal Neurologic: Grossly normal    ECOG FS:  Grade 1 -  Symptomatic but completely ambulatory   LAB RESULTS: Lab Results  Component Value Date   WBC 6.2 03/19/2011   NEUTROABS 3.3 03/19/2011   HGB 12.6 03/19/2011   HCT 37.5 03/19/2011   MCV 93.1 03/19/2011   PLT 211 03/19/2011      Chemistry      Component Value Date/Time   NA 137 03/19/2011 1258   K 4.0 03/19/2011 1258   CL 101 03/19/2011 1258   CO2 26 03/19/2011 1258   BUN 17 03/19/2011 1258   CREATININE 0.84 03/19/2011 1258      Component Value Date/Time   CALCIUM 9.3 03/19/2011 1258   ALKPHOS 42 03/19/2011 1258   AST 18 03/19/2011 1258   ALT 14 03/19/2011 1258   BILITOT 0.3 03/19/2011 1258      Lab Results  Component Value Date   LABCA2 22 03/19/2011    RADIOGRAPHIC STUDIES: No results found.  ASSESSMENT: 67 y.o[place], [place] woman with:  1.   Status post lumpectomy with sentinel node biopsy on 07/17/2003 for a stage I, T1c N0 Invasive ductal carcinoma of the left breast, grade 1, ER 95%, PR 92%, Ki- 67 3%, HER2/neu no amplification.  0/7 positive lymph nodes.  Status post adjuvant chemotherapy of CMF x 6 cycles completed on 12/13/2003. Status post radiation therapy from completed on 03/02/2004. The patient completed five years of anti-estrogen therapy with Arimidex in 12/2008.  2.  The patient was a participant in the [place].27 randomized phase III trial of Exemestane versus Anastrozole in postmenopausal women with receptor positive primary breast cancer.    3. Bone density scan on 07/08/2011 showed a T score of -2.0 (osteopenia).  4.  Her last mammogram on 07/08/2011 showed no suspicious masses are calcifications.  5.  Vaginal dryness  PLAN: 1.  The patient takes Q7 day Fosamax 35 mg PO for osteopenia.  An electronic prescription for this medication #8 with 12 refills was sent to the patient's pharmacy. The patient will also continue to take calcium with vitamin D daily.  The patient will be due for another bone density scan in 06/2013.  2.  The patient will be due for her annual  digital bilateral  screening mammogram in 06/2012.  3.  The patient will continue to use Vagifem 25 mcg tablet Q7days for vaginal dryness.  4.  We plan to see the patient again in 1 year at which time we will check a CBC, CMP and vitamin D level.  All questions were answered.  The patient was encouraged to contact us in the interim with any problems, questions or concerns.    Larina Bras, NP-C 03/24/2012, 1:19 PM

## 2012-03-24 NOTE — Patient Instructions (Addendum)
Please contact us at [phone] if you have any questions or concerns.  Keep up the great work.

## 2012-03-24 NOTE — Telephone Encounter (Signed)
Pt sent back to lb and given schedule for March 2015.

## 2012-03-25 LAB — VITAMIN D 25 HYDROXY (VIT D DEFICIENCY, FRACTURES): Vit D, 25-Hydroxy: 44 ng/mL (ref 30–89)

## 2012-03-30 ENCOUNTER — Other Ambulatory Visit: Payer: Medicare Other | Admitting: Lab

## 2012-03-30 ENCOUNTER — Ambulatory Visit: Payer: Medicare Other | Admitting: Oncology

## 2012-08-16 ENCOUNTER — Other Ambulatory Visit: Payer: Self-pay

## 2012-08-23 ENCOUNTER — Other Ambulatory Visit: Payer: Self-pay | Admitting: *Deleted

## 2012-08-23 DIAGNOSIS — Z853 Personal history of malignant neoplasm of breast: Secondary | ICD-10-CM

## 2012-08-23 MED ORDER — ALENDRONATE SODIUM 35 MG PO TABS: 35.0000 mg | ORAL_TABLET | ORAL | Status: DC

## 2012-11-01 ENCOUNTER — Encounter: Payer: Self-pay | Admitting: Nurse Practitioner

## 2012-11-03 ENCOUNTER — Encounter: Payer: Self-pay | Admitting: Nurse Practitioner

## 2012-11-03 ENCOUNTER — Ambulatory Visit (INDEPENDENT_AMBULATORY_CARE_PROVIDER_SITE_OTHER): Payer: Medicare Other | Admitting: Nurse Practitioner

## 2012-11-03 VITALS — BP 126/60 | HR 74 | Resp 16 | Ht 65.25 in | Wt 122.0 lb

## 2012-11-03 DIAGNOSIS — E559 Vitamin D deficiency, unspecified: Secondary | ICD-10-CM

## 2012-11-03 DIAGNOSIS — Z0000 Encounter for general adult medical examination without abnormal findings: Secondary | ICD-10-CM

## 2012-11-03 DIAGNOSIS — Z01419 Encounter for gynecological examination (general) (routine) without abnormal findings: Secondary | ICD-10-CM

## 2012-11-03 LAB — POCT URINALYSIS DIPSTICK
Ketones, UA: NEGATIVE
Leukocytes, UA: NEGATIVE
Nitrite, UA: NEGATIVE
Protein, UA: NEGATIVE
Urobilinogen, UA: NEGATIVE

## 2012-11-03 LAB — HEMOGLOBIN, FINGERSTICK: Hemoglobin, fingerstick: 13.2 g/dL (ref 12.0–16.0)

## 2012-11-03 MED ORDER — ESTRADIOL 10 MCG VA TABS: 10.0000 ug | ORAL_TABLET | VAGINAL | Status: DC

## 2012-11-03 MED ORDER — CALCIUM CARBONATE-VITAMIN D 500-200 MG-UNIT PO TABS: 1.0000 | ORAL_TABLET | Freq: Every day | ORAL | Status: AC

## 2012-11-03 NOTE — Patient Instructions (Signed)

## 2012-11-03 NOTE — Progress Notes (Signed)
Patient ID: Diana Martin, female   DOB: 02-11-1945, 67 y.o.   MRN: [id number] 67 y.o. G0P0 Married Caucasian Fe here for annual exam.  No new diagnosis. Mother now 61 will be going into assisted living in the next month - they are looking at different places.   No LMP recorded. Patient is postmenopausal.          Sexually active: no  The current method of family planning is none.    Exercising: yes  Home exercise routine includes walking 5-6 times per week and weights.. Smoker:  no  Health Maintenance: Pap: 09/23/09, WNL MMG: 07/10/12, BI-RADS 2: benign findings Colonoscopy: 05/06/08, 5 year recall BMD: 07/08/11, low bone mass TDaP: 08/2009 Shingles vaccine: 2011 Labs: HB: 13.2 Urine: negative, pH 6.0   reports that she has never smoked. She has never used smokeless tobacco. She reports that she drinks about 4.2 ounces of alcohol per week. She reports that she does not use illicit drugs.  Past Medical History  Diagnosis Date  . Breast cancer   . Osteopenia   . Atrophic vaginitis   . Vitamin D deficiency     Past Surgical History  Procedure Laterality Date  . Breast lumpectomy Left 07/16/2003  . Sentinel lymph node biopsy Left 07/16/03  . Breast biopsy Left 2000  . Tonsillectomy  as child  . Wisdom tooth extraction  in college  . Pilonidal cyst excision  age 61  . Breast cyst aspiration Left 1995    Current Outpatient Prescriptions  Medication Sig Dispense Refill  . alendronate (FOSAMAX) 35 MG tablet Take 1 tablet (35 mg total) by mouth every 7 (seven) days. Take with a full glass of water on an empty stomach.  12 tablet  3  . calcium-vitamin D (OSCAL WITH D) 500-200 MG-UNIT per tablet Take 1 tablet by mouth daily.  30 tablet  3  . Estradiol (VAGIFEM) 10 MCG TABS vaginal tablet Place 1 tablet (10 mcg total) vaginally 2 (two) times a week.  24 tablet  3   No current facility-administered medications for this visit.    Family History  Problem Relation Age of Onset  . Lung  cancer Father 86  . Cervical cancer Paternal Aunt   . Stomach cancer Maternal Grandmother   . Heart disease Mother     ROS:  Pertinent items are noted in HPI.  Otherwise, a comprehensive ROS was negative.  Exam:   BP 126/60  Pulse 74  Resp 16  Ht 5' 5.25" (1.657 m)  Wt 122 lb (55.339 kg)  BMI 20.16 kg/m2 Height: 5' 5.25" (165.7 cm)  Ht Readings from Last 3 Encounters:  11/03/12 5' 5.25" (1.657 m)  03/24/12 5\' 5"  (1.651 m)  03/19/11 5\' 5"  (1.651 m)    General appearance: alert, cooperative and appears stated age Head: Normocephalic, without obvious abnormality, atraumatic Neck: no adenopathy, supple, symmetrical, trachea midline and thyroid normal to inspection and palpation Lungs: clear to auscultation bilaterally Breasts: normal appearance, no masses or tenderness, positive findings: surgical and radiation changes on the left. There remains telangiectasis changes of the left breast. Heart: regular rate and rhythm Abdomen: soft, non-tender; no masses,  no organomegaly Extremities: extremities normal, atraumatic, no cyanosis or edema Skin: Skin color, texture, turgor normal. No rashes or lesions Lymph nodes: Cervical, supraclavicular, and axillary nodes normal. No abnormal inguinal nodes palpated Neurologic: Grossly normal   Pelvic: External genitalia:  no lesions              Urethra:  normal appearing urethra with no masses, tenderness or lesions              Bartholin's and Skene's: normal                 Vagina: normal appearing vagina with normal color and discharge, no lesions              Cervix: anteverted              Pap taken: no Bimanual Exam:  Uterus:  normal size, contour, position, consistency, mobility, non-tender              Adnexa: no mass, fullness, tenderness               Rectovaginal: Confirms               Anus:  normal sphincter tone, no lesions  A:  Well Woman with normal exam  S/P left breast cancer with lumpectomy, chemo and radiation  2/06  Postmenopausal  Atrophic vaginitis on Vagifem - OK 'd by Oncologist  Osteopenia  Vit D deficiency   P:   Pap smear as per guidelines   Mammogram due 6/15  Refill on Vagifem 1-2 times weekly for a year  Counseled on risk of ERT with DVT, CVA, and cancer  Recheck Vit D level  Counseled on breast self exam, osteoporosis, adequate intake of calcium and vitamin D, diet and exercise, Kegel's exercises return annually or prn  An After Visit Summary was printed and given to the patient.

## 2012-11-05 NOTE — Progress Notes (Signed)
Encounter reviewed by Dr. Brook Silva.  

## 2012-11-16 ENCOUNTER — Other Ambulatory Visit: Payer: Self-pay

## 2013-03-30 ENCOUNTER — Other Ambulatory Visit: Payer: Self-pay | Admitting: *Deleted

## 2013-03-30 DIAGNOSIS — Z853 Personal history of malignant neoplasm of breast: Secondary | ICD-10-CM

## 2013-04-02 ENCOUNTER — Encounter: Payer: Self-pay | Admitting: Oncology

## 2013-04-02 ENCOUNTER — Other Ambulatory Visit (HOSPITAL_BASED_OUTPATIENT_CLINIC_OR_DEPARTMENT_OTHER): Payer: Medicare Other

## 2013-04-02 ENCOUNTER — Ambulatory Visit (HOSPITAL_BASED_OUTPATIENT_CLINIC_OR_DEPARTMENT_OTHER): Payer: Medicare Other | Admitting: Oncology

## 2013-04-02 VITALS — BP 128/73 | HR 85 | Temp 98.1°F | Resp 18 | Ht 65.25 in | Wt 124.9 lb

## 2013-04-02 DIAGNOSIS — Z853 Personal history of malignant neoplasm of breast: Secondary | ICD-10-CM

## 2013-04-02 DIAGNOSIS — M899 Disorder of bone, unspecified: Secondary | ICD-10-CM

## 2013-04-02 DIAGNOSIS — M949 Disorder of cartilage, unspecified: Secondary | ICD-10-CM

## 2013-04-02 LAB — COMPREHENSIVE METABOLIC PANEL (CC13)
ALT: 15 U/L (ref 0–55)
AST: 18 U/L (ref 5–34)
Albumin: 4.3 g/dL (ref 3.5–5.0)
Alkaline Phosphatase: 42 U/L (ref 40–150)
Anion Gap: 10 mEq/L (ref 3–11)
BUN: 18.3 mg/dL (ref 7.0–26.0)
CO2: 25 mEq/L (ref 22–29)
Calcium: 10 mg/dL (ref 8.4–10.4)
Chloride: 107 mEq/L (ref 98–109)
Creatinine: 0.8 mg/dL (ref 0.6–1.1)
Glucose: 103 mg/dl (ref 70–140)
Potassium: 4.1 mEq/L (ref 3.5–5.1)
SODIUM: 142 meq/L (ref 136–145)
TOTAL PROTEIN: 7.2 g/dL (ref 6.4–8.3)
Total Bilirubin: 0.21 mg/dL (ref 0.20–1.20)

## 2013-04-02 LAB — CBC WITH DIFFERENTIAL/PLATELET
BASO%: 0.7 % (ref 0.0–2.0)
BASOS ABS: 0.1 10*3/uL (ref 0.0–0.1)
EOS%: 0.6 % (ref 0.0–7.0)
Eosinophils Absolute: 0 10*3/uL (ref 0.0–0.5)
HCT: 37.6 % (ref 34.8–46.6)
HEMOGLOBIN: 12.6 g/dL (ref 11.6–15.9)
LYMPH%: 39.5 % (ref 14.0–49.7)
MCH: 31.3 pg (ref 25.1–34.0)
MCHC: 33.4 g/dL (ref 31.5–36.0)
MCV: 93.6 fL (ref 79.5–101.0)
MONO#: 0.5 10*3/uL (ref 0.1–0.9)
MONO%: 6.5 % (ref 0.0–14.0)
NEUT#: 3.9 10*3/uL (ref 1.5–6.5)
NEUT%: 52.7 % (ref 38.4–76.8)
PLATELETS: 216 10*3/uL (ref 145–400)
RBC: 4.02 10*6/uL (ref 3.70–5.45)
RDW: 13.5 % (ref 11.2–14.5)
WBC: 7.3 10*3/uL (ref 3.9–10.3)
lymph#: 2.9 10*3/uL (ref 0.9–3.3)

## 2013-04-02 NOTE — Progress Notes (Signed)
[place] OFFICE PROGRESS NOTE  Patient Care Team: Burnis Medin, MD as PCP - General Eston Esters, MD as Consulting Physician (Oncology)  DIAGNOSIS: 68 year old [place] woman with invasive ductal carcinoma of the left breast diagnosed in 06/2003.   SUMMARY OF ONCOLOGIC HISTORY: 1. Status post lumpectomy with sentinel node biopsy on 07/17/2003 for a stage I, T1c N0 Invasive ductal carcinoma of the left breast, grade 1, ER 95%, PR 92%, Ki- 67 3%, HER2/neu no amplification. 0/7 positive lymph nodes.  2. Status post adjuvant chemotherapy of CMF x 6 cycles from 08/16/2003 through 12/13/2003.  3. Status post radiation therapy from 01/16/2004 through 03/02/2004.  4. Since 07/2004 the patient was a participant in the MA.27 randomized phase III trial of Exemestane versus Anastrozole in postmenopausal women with receptor positive primary breast cancer. She was assigned to receive Anastrozole.  5. The patient completed five years of anti-estrogen therapy with Arimidex in 12/2008.  6. Bone density scan on 07/08/2011 showed a T score of -2.0 (osteopenia).   CURRENT THERAPY: Annual mammography and observation.   INTERVAL HISTORY: Diana Martin 68 y.o. female returns for followup visit today. This is her 10th year since diagnosis of breast cancer. Clinically she seems to be doing well. She has no evidence of recurrent disease. She continues to have some vaginal dryness she is on Vagifem. She has some night sweats and occasional constipation. She is ready to graduate from the breast program.  I have reviewed the past medical history, past surgical history, social history and family history with the patient and they are unchanged from previous note.  ALLERGIES:  has No Known Allergies.  MEDICATIONS:  Current Outpatient Prescriptions  Medication Sig Dispense Refill  . alendronate (FOSAMAX) 35 MG tablet Take 1 tablet (35 mg total) by mouth every 7 (seven) days. Take  with a full glass of water on an empty stomach.  12 tablet  3  . calcium-vitamin D (OSCAL WITH D) 500-200 MG-UNIT per tablet Take 1 tablet by mouth daily.  30 tablet  3  . Estradiol (VAGIFEM) 10 MCG TABS vaginal tablet Place 1 tablet (10 mcg total) vaginally 2 (two) times a week.  24 tablet  3   No current facility-administered medications for this visit.    REVIEW OF SYSTEMS:   Constitutional: Denies fevers, chills or abnormal weight loss Eyes: Denies blurriness of vision Ears, nose, mouth, throat, and face: Denies mucositis or sore throat Respiratory: Denies cough, dyspnea or wheezes Cardiovascular: Denies palpitation, chest discomfort or lower extremity swelling Gastrointestinal:  Denies nausea, heartburn or change in bowel habits Skin: Denies abnormal skin rashes Lymphatics: Denies new lymphadenopathy or easy bruising Neurological:Denies numbness, tingling or new weaknesses Behavioral/Psych: Mood is stable, no new changes  All other systems were reviewed with the patient and are negative.  PHYSICAL EXAMINATION: ECOG PERFORMANCE STATUS: 0 - Asymptomatic  Filed Vitals:   04/02/13 1444  BP: 128/73  Pulse: 85  Temp: 98.1 F (36.7 C)  Resp: 18   Filed Weights   04/02/13 1444  Weight: 124 lb 14.4 oz (56.654 kg)    GENERAL:alert, no distress and comfortable SKIN: skin color, texture, turgor are normal, no rashes or significant lesions EYES: normal, Conjunctiva are pink and non-injected, sclera clear OROPHARYNX:no exudate, no erythema and lips, buccal mucosa, and tongue normal  NECK: supple, thyroid normal size, non-tender, without nodularity LYMPH:  no palpable lymphadenopathy in the cervical, axillary or inguinal LUNGS: clear to auscultation and percussion with normal breathing effort  HEART: regular rate & rhythm and no murmurs and no lower extremity edema ABDOMEN:abdomen soft, non-tender and normal bowel sounds Musculoskeletal:no cyanosis of digits and no clubbing  NEURO:  alert & oriented x 3 with fluent speech, no focal motor/sensory deficits  LABORATORY DATA:  I have reviewed the data as listed    Component Value Date/Time   NA 142 04/02/2013 1408   NA 137 03/19/2011 1258   K 4.1 04/02/2013 1408   K 4.0 03/19/2011 1258   CL 105 03/24/2012 1410   CL 101 03/19/2011 1258   CO2 25 04/02/2013 1408   CO2 26 03/19/2011 1258   GLUCOSE 103 04/02/2013 1408   GLUCOSE 102* 03/24/2012 1410   GLUCOSE 116* 03/19/2011 1258   BUN 18.3 04/02/2013 1408   BUN 17 03/19/2011 1258   CREATININE 0.8 04/02/2013 1408   CREATININE 0.84 03/19/2011 1258   CALCIUM 10.0 04/02/2013 1408   CALCIUM 9.3 03/19/2011 1258   PROT 7.2 04/02/2013 1408   PROT 7.0 03/19/2011 1258   ALBUMIN 4.3 04/02/2013 1408   ALBUMIN 4.7 03/19/2011 1258   AST 18 04/02/2013 1408   AST 18 03/19/2011 1258   ALT 15 04/02/2013 1408   ALT 14 03/19/2011 1258   ALKPHOS 42 04/02/2013 1408   ALKPHOS 42 03/19/2011 1258   BILITOT 0.21 04/02/2013 1408   BILITOT 0.3 03/19/2011 1258   GFRNONAA >60 06/30/2006 1000   GFRAA  Value: >60        The eGFR has been calculated using the MDRD equation. This calculation has not been validated in all clinical 06/30/2006 1000    No results found for this basename: SPEP, UPEP,  kappa and lambda light chains    Lab Results  Component Value Date   WBC 7.3 04/02/2013   NEUTROABS 3.9 04/02/2013   HGB 12.6 04/02/2013   HCT 37.6 04/02/2013   MCV 93.6 04/02/2013   PLT 216 04/02/2013      Chemistry      Component Value Date/Time   NA 142 04/02/2013 1408   NA 137 03/19/2011 1258   K 4.1 04/02/2013 1408   K 4.0 03/19/2011 1258   CL 105 03/24/2012 1410   CL 101 03/19/2011 1258   CO2 25 04/02/2013 1408   CO2 26 03/19/2011 1258   BUN 18.3 04/02/2013 1408   BUN 17 03/19/2011 1258   CREATININE 0.8 04/02/2013 1408   CREATININE 0.84 03/19/2011 1258      Component Value Date/Time   CALCIUM 10.0 04/02/2013 1408   CALCIUM 9.3 03/19/2011 1258   ALKPHOS 42 04/02/2013 1408   ALKPHOS 42 03/19/2011 1258   AST 18 04/02/2013 1408   AST 18  03/19/2011 1258   ALT 15 04/02/2013 1408   ALT 14 03/19/2011 1258   BILITOT 0.21 04/02/2013 1408   BILITOT 0.3 03/19/2011 1258       RADIOGRAPHIC STUDIES: I have personally reviewed the radiological images as listed and agreed with the findings in the report. No results found.    ASSESSMENT & PLAN:  68 year old female with  #1 history of stage I (T1 C. N0) invasive ductal carcinoma of the left breast grade 1 ER +95% PR +92% proliferation marker Ki-67 3% HER-2/neu negative. Patient underwent a lumpectomy with sentinel lymph node biopsy on 07/17/2003 with the final pathology revealing a T1 C. disease 07 lymph nodes positive for metastatic disease. She was treated with adjuvant CMF x6 cycles completed 12/13/2003. This was followed by radiation therapy completed 03/02/2004. She completed 5 years of antiestrogen  therapy with a remedy axilla in December 2010. She has no evidence of recurrent disease. Patient was a participant in the MA 27 randomized phase 3 trial of exemestane versus anastrozole in postmenopausal women with receptor positive primary breast cancer.  #2 overall patient has done remarkably well. She is a true breast cancer survivor. We discussed survivorship issues. She is encouraged to continue doing self breast examinations and annual surveillance mammograms exercise eating healthy and maintain a good BMI. She will continue to be seen by her primary care physician. I will see her on an as-needed basis.  No orders of the defined types were placed in this encounter.   All questions were answered. The patient knows to call the clinic with any problems, questions or concerns. No barriers to learning was detected. I spent 15 minutes counseling the patient face to face. The total time spent in the appointment was 30 minutes and more than 50% was on counseling and review of test results     Marcy Panning, MD 04/02/2013 3:45 PM

## 2013-04-03 LAB — VITAMIN D 25 HYDROXY (VIT D DEFICIENCY, FRACTURES): Vit D, 25-Hydroxy: 53 ng/mL (ref 30–89)

## 2013-04-17 ENCOUNTER — Telehealth: Payer: Self-pay | Admitting: Internal Medicine

## 2013-04-17 NOTE — Telephone Encounter (Signed)
Pt has medicare now and would like to re-est with dr Regis Bill. Can I sch?

## 2013-04-17 NOTE — Telephone Encounter (Signed)
Last visit was around 2009

## 2013-04-26 NOTE — Telephone Encounter (Signed)
Pt following up on request.

## 2013-05-03 NOTE — Telephone Encounter (Signed)
Pt is calling back checking on status

## 2013-05-28 NOTE — Telephone Encounter (Signed)
Ok  To [employer]

## 2013-05-28 NOTE — Telephone Encounter (Signed)
Pt has moved on

## 2013-06-12 ENCOUNTER — Telehealth: Payer: Self-pay | Admitting: Nurse Practitioner

## 2013-06-12 NOTE — Telephone Encounter (Signed)
Pt needs an order for her bone density sent to [facility]. Appointment is 07/16/13.

## 2013-06-12 NOTE — Telephone Encounter (Signed)
Pt's last BMD 06-2011. Dx osteopenia. Recommended repeat in 2 years. Order sent to [facility] to sign.

## 2013-06-12 NOTE — Telephone Encounter (Signed)
BMD is signed - closed encounter

## 2013-07-23 ENCOUNTER — Telehealth: Payer: Self-pay | Admitting: *Deleted

## 2013-07-23 NOTE — Telephone Encounter (Signed)
I have attempted to contact this patient by phone with the following results: message left to return my call with Pilar Plate, husband at home number.  RE:  Bone density results.  Hard copy in basket on my desk.

## 2013-07-24 NOTE — Telephone Encounter (Signed)
Message left for pt to return call at her convenience on home voice mail.

## 2013-07-24 NOTE — Telephone Encounter (Signed)
Patient is returning call to stephanie. 

## 2013-07-25 NOTE — Telephone Encounter (Signed)
Pt notified of BMD results per written note on hardcopy.  She voices understanding and is agreeable with plan. She does body pump twice weekly, but will increase weights.  She wonders about a drug holiday, but will discuss with PCP at appt in August or with [place] at appt in October.    Pt states she has "graduated" from [employer] and is very excited about this.  Pt wanted you to know.  Hard copy sent to scan.

## 2013-08-17 ENCOUNTER — Telehealth: Payer: Self-pay

## 2013-08-17 NOTE — Telephone Encounter (Signed)
S/w pt that we have refill request from [facility] for fosamax. We will only be seeing pt on an as needed basis. Pt volunteered to speak with Dixie about contacting her PCP for refills.

## 2013-08-23 ENCOUNTER — Other Ambulatory Visit: Payer: Self-pay | Admitting: *Deleted

## 2013-08-23 DIAGNOSIS — Z853 Personal history of malignant neoplasm of breast: Secondary | ICD-10-CM

## 2013-08-24 ENCOUNTER — Telehealth: Payer: Self-pay | Admitting: Internal Medicine

## 2013-08-24 NOTE — Telephone Encounter (Signed)
Denied.  Pt is not established with Dr. Regis Bill.  See telephone note from 04/17/2013.

## 2013-08-24 NOTE — Telephone Encounter (Signed)
Page, [facility] is requesting re-fill on alendronate (FOSAMAX) 35 MG tablet

## 2013-11-06 ENCOUNTER — Encounter: Payer: Self-pay | Admitting: Nurse Practitioner

## 2013-11-06 ENCOUNTER — Ambulatory Visit (INDEPENDENT_AMBULATORY_CARE_PROVIDER_SITE_OTHER): Payer: Medicare Other | Admitting: Nurse Practitioner

## 2013-11-06 VITALS — BP 110/64 | HR 88 | Ht 65.25 in | Wt 123.0 lb

## 2013-11-06 DIAGNOSIS — Z0000 Encounter for general adult medical examination without abnormal findings: Secondary | ICD-10-CM

## 2013-11-06 DIAGNOSIS — N952 Postmenopausal atrophic vaginitis: Secondary | ICD-10-CM

## 2013-11-06 DIAGNOSIS — C50912 Malignant neoplasm of unspecified site of left female breast: Secondary | ICD-10-CM

## 2013-11-06 DIAGNOSIS — M8580 Other specified disorders of bone density and structure, unspecified site: Secondary | ICD-10-CM

## 2013-11-06 DIAGNOSIS — Z01419 Encounter for gynecological examination (general) (routine) without abnormal findings: Secondary | ICD-10-CM

## 2013-11-06 MED ORDER — ESTRADIOL 10 MCG VA TABS: 10.0000 ug | ORAL_TABLET | VAGINAL | Status: DC

## 2013-11-06 NOTE — Progress Notes (Signed)
Patient ID: Diana Martin, female   DOB: [birth year], 68 y.o.   MRN: [id number] 68 y.o. G0P0 Married Caucasian Fe here for annual exam.  Caring for mom who is 59;  She had 7 hospitalizations in 14 months secondary to cardiac 'spells'.  Mother is now on 1/2 tablet of Xanax and has calmed down and feels better.  Patient has a new PCP - Dr. Leighton Ruff.  No longer seeing oncologist - 10 years out from breast cancer.  No LMP recorded. Patient is postmenopausal.          Sexually active: No.  The current method of family planning is post menopausal status.    Exercising: Yes.    Home exercise routine includes walking 5-6 times per week and weights. Smoker:  no  Health Maintenance: Pap: 09/23/09, WNL MMG: 07/16/13, 3D, benign Colonoscopy: 05/06/08, 5 year recall BMD: 07/16/13, osteopenia at hips, low normal at spine TDaP: 08/2009 Shingles: 2011 Labs: HB:  PCP  Urine: PCP   reports that she has never smoked. She has never used smokeless tobacco. She reports that she drinks about 4.2 ounces of alcohol per week. She reports that she does not use illicit drugs.  Past Medical History  Diagnosis Date  . Breast cancer   . Osteopenia   . Atrophic vaginitis   . Vitamin D deficiency     Past Surgical History  Procedure Laterality Date  . Breast lumpectomy Left 07/16/2003  . Sentinel lymph node biopsy Left 07/16/03  . Breast biopsy Left 2000  . Tonsillectomy  as child  . Wisdom tooth extraction  in college  . Pilonidal cyst excision  age 56  . Breast cyst aspiration Left 1995    Current Outpatient Prescriptions  Medication Sig Dispense Refill  . alendronate (FOSAMAX) 35 MG tablet Take 1 tablet (35 mg total) by mouth every 7 (seven) days. Take with a full glass of water on an empty stomach.  12 tablet  3  . calcium-vitamin D (OSCAL WITH D) 500-200 MG-UNIT per tablet Take 1 tablet by mouth daily.  30 tablet  3  . Estradiol (VAGIFEM) 10 MCG TABS vaginal tablet Place 1 tablet (10 mcg total)  vaginally 2 (two) times a week.  24 tablet  3   No current facility-administered medications for this visit.    Family History  Problem Relation Age of Onset  . Lung cancer Father 83  . Cervical cancer Paternal Aunt   . Stomach cancer Maternal Grandmother   . Heart disease Mother     ROS:  Pertinent items are noted in HPI.  Otherwise, a comprehensive ROS was negative.  Exam:   BP 110/64  Pulse 88  Ht 5' 5.25" (1.657 m)  Wt 123 lb (55.792 kg)  BMI 20.32 kg/m2 Height: 5' 5.25" (165.7 cm)  Ht Readings from Last 3 Encounters:  11/06/13 5' 5.25" (1.657 m)  04/02/13 5' 5.25" (1.657 m)  11/03/12 5' 5.25" (1.657 m)    General appearance: alert, cooperative and appears stated age Head: Normocephalic, without obvious abnormality, atraumatic Neck: no adenopathy, supple, symmetrical, trachea midline and thyroid normal to inspection and palpation Lungs: clear to auscultation bilaterally Breasts: normal appearance, no masses or tenderness, on the right.  Left breast surgica and radiation changes with telangiectasis Heart: regular rate and rhythm Abdomen: soft, non-tender; no masses,  no organomegaly Extremities: extremities normal, atraumatic, no cyanosis or edema Skin: Skin color, texture, turgor normal. No rashes or lesions Lymph nodes: Cervical, supraclavicular, and axillary nodes normal. No  abnormal inguinal nodes palpated Neurologic: Grossly normal   Pelvic: External genitalia:  no lesions              Urethra:  normal appearing urethra with no masses, tenderness or lesions              Bartholin's and Skene's: normal                 Vagina: normal appearing vagina with normal color and discharge, no lesions              Cervix: anteverted              Pap taken: Yes.   Bimanual Exam:  Uterus:  normal size, contour, position, consistency, mobility, non-tender              Adnexa: no mass, fullness, tenderness               Rectovaginal: Confirms               Anus:  normal  sphincter tone, no lesions  A:  Well Woman with normal exam  S/P left breast cancer with lumpectomy, chemo and radiation 2/06   Postmenopausal   Atrophic vaginitis on Vagifem - OK 'd by Oncologist   Osteopenia on Fosamax since about 2008  Vit D deficiency   P:   Reviewed health and wellness pertinent to exam  Pap smear taken today  Mammogram is due 7/16  Refilled Vagifem for a year  Counseled with risk of DVT, CVA, cancer, etc.  Counseled on breast self exam, mammography screening, osteoporosis, adequate intake of calcium and vitamin D, diet and exercise, Kegel's exercises return annually or prn  An After Visit Summary was printed and given to the patient.

## 2013-11-06 NOTE — Patient Instructions (Addendum)

## 2013-11-07 LAB — IPS PAP SMEAR ONLY

## 2013-11-08 ENCOUNTER — Encounter: Payer: Self-pay | Admitting: Nurse Practitioner

## 2013-11-08 NOTE — Telephone Encounter (Signed)
Please document and notify patient.

## 2013-11-09 NOTE — Progress Notes (Signed)
I recommend patient discontinue Fosamax and redo the BMD in July 2017. There has been some bone loss of the hip, but she is still in a category of osteoporosis.  Re-evaluate in 2017.  Encounter reviewed by Dr. Josefa Half.

## 2013-11-13 ENCOUNTER — Telehealth: Payer: Self-pay | Admitting: Nurse Practitioner

## 2013-11-22 NOTE — Telephone Encounter (Signed)
Left another message for patient to call back about discussion of BMD.  She was told to either call back or if better to e-mail me, then I can e-mail her back with our recommendations.  (I have discussed BMD with Dr. Quincy Simmonds and we have reviewed several past BMD and she is stable.  We feel that she can do a "Drug Holiday" with Fosamax).

## 2013-11-26 NOTE — Telephone Encounter (Signed)
Patient called back today stating [place] and did not get my message until return home last pm.  She is informed that I have reviewed last BMD dating back to 2007 thru 2014.  We are aware that she has been on Fosamax since 2008 till now, and she has a stable BMD.  She is informed that she may take a 'drug holiday' and recheck BMD in 2 years.  She is in agreement and will do so.

## 2013-12-04 ENCOUNTER — Other Ambulatory Visit: Payer: Self-pay | Admitting: Nurse Practitioner

## 2014-07-18 ENCOUNTER — Other Ambulatory Visit: Payer: Self-pay | Admitting: Nurse Practitioner

## 2014-07-19 NOTE — Telephone Encounter (Signed)
Medication refill request: Vagifem 10 mcg  Last AEX:  11/06/13 with PG  Next AEX: 03/11/15 with PG Last MMG (if hormonal medication request): 07/16/13  bi-rads 2: benign Refill authorized: #24/2 rfs, pleae advise.

## 2014-07-19 NOTE — Telephone Encounter (Signed)
Awaiting current mammogram for further refill

## 2014-07-23 ENCOUNTER — Telehealth: Payer: Self-pay | Admitting: Nurse Practitioner

## 2014-07-23 NOTE — Telephone Encounter (Signed)
Patient has a question about her vagifem medication. Picked up a 2 months supply at the pharmacy but she's concerned that she will run out before coming back in to see Patty.

## 2014-07-24 MED ORDER — ESTRADIOL 10 MCG VA TABS: ORAL_TABLET | VAGINAL | Status: DC

## 2014-07-24 NOTE — Telephone Encounter (Signed)
Vagifem #8/2 rfs sent to [facility], patient is aware.  Routed to provider for review, encounter closed.

## 2014-07-24 NOTE — Telephone Encounter (Signed)
Spoke with patient. Patient states that she only has two months of Vagifem left at this time. Last aex was 11/06/2013. Next aex is scheduled for 03/11/2015. "Since all of my appointments are due around the same tim Patty told me it was okay to wait a year and 6 months before coming back." Patient had mammogram on 07/19/2014. Requesting refills on Vagifem until her aex on 03/11/2015. Advised will speak with Milford Cage, FNP regarding refill and return call. Patient is agreeable.

## 2014-07-24 NOTE — Telephone Encounter (Signed)
Ok for a refill for 6 months on Vagifem.

## 2014-11-05 ENCOUNTER — Other Ambulatory Visit: Payer: Self-pay | Admitting: Nurse Practitioner

## 2014-11-05 NOTE — Telephone Encounter (Signed)
Medication refill request: Vagifem Last AEX:  11-06-13  Next AEX: 03-11-15 Last MMG (if hormonal medication request): 07-28-13 WNl Refill authorized: please advise

## 2014-11-05 NOTE — Telephone Encounter (Signed)
Mammo dated 07/19/14 is normal

## 2014-11-05 NOTE — Telephone Encounter (Signed)
Needs update on Mammogram before vaginal estrogen can be given.

## 2014-11-05 NOTE — Telephone Encounter (Signed)
I put updated MMG on your desk :) 07-19-14: WNL

## 2015-03-11 ENCOUNTER — Encounter: Payer: Self-pay | Admitting: Nurse Practitioner

## 2015-03-11 ENCOUNTER — Ambulatory Visit (INDEPENDENT_AMBULATORY_CARE_PROVIDER_SITE_OTHER): Payer: Medicare Other | Admitting: Nurse Practitioner

## 2015-03-11 VITALS — BP 120/82 | HR 88 | Ht 65.0 in | Wt 123.0 lb

## 2015-03-11 DIAGNOSIS — M8580 Other specified disorders of bone density and structure, unspecified site: Secondary | ICD-10-CM | POA: Insufficient documentation

## 2015-03-11 DIAGNOSIS — Z0000 Encounter for general adult medical examination without abnormal findings: Secondary | ICD-10-CM

## 2015-03-11 DIAGNOSIS — N952 Postmenopausal atrophic vaginitis: Secondary | ICD-10-CM | POA: Diagnosis not present

## 2015-03-11 DIAGNOSIS — C50512 Malignant neoplasm of lower-outer quadrant of left female breast: Secondary | ICD-10-CM

## 2015-03-11 DIAGNOSIS — Z01419 Encounter for gynecological examination (general) (routine) without abnormal findings: Secondary | ICD-10-CM | POA: Diagnosis not present

## 2015-03-11 DIAGNOSIS — Z1211 Encounter for screening for malignant neoplasm of colon: Secondary | ICD-10-CM

## 2015-03-11 LAB — HEPATITIS C ANTIBODY: HCV Ab: NEGATIVE

## 2015-03-11 LAB — POCT URINALYSIS DIPSTICK
Bilirubin, UA: NEGATIVE
GLUCOSE UA: NEGATIVE
Ketones, UA: NEGATIVE
Leukocytes, UA: NEGATIVE
NITRITE UA: NEGATIVE
PROTEIN UA: NEGATIVE
RBC UA: NEGATIVE
Urobilinogen, UA: NEGATIVE
pH, UA: 5

## 2015-03-11 MED ORDER — ESTRADIOL 10 MCG VA TABS: ORAL_TABLET | VAGINAL | Status: DC

## 2015-03-11 NOTE — Progress Notes (Signed)
Encounter reviewed by Dr. Brook Amundson C. Silva.  

## 2015-03-11 NOTE — Patient Instructions (Addendum)

## 2015-03-11 NOTE — Progress Notes (Signed)
Patient ID: Diana Martin, female   DOB: 1945/12/28, 70 y.o.   MRN: [id number]  70 y.o. G0P0 Married  Caucasian Fe here for annual exam.  No new problems.  She is caring for her mother now 77.  No LMP recorded. Patient is postmenopausal.          Sexually active: No.  The current method of family planning is post menopausal status.    Exercising: Yes.    walking and weights Smoker:  no  Health Maintenance: Pap: 11/06/13, Negative MMG: 07/19/14, 3D, Bi-Rads 2: benign findings Colonoscopy: 05/06/08, originally 5 year recall due to breast cancer, now repeat in 10 years BMD: 07/16/13, osteopenia at hips, low normal at spine TDaP: 08/2009 Shingles: done Pneumonia: 09/06/13 Hep C and HIV: will do today Labs: PCP   Urine:  negative   reports that she has never smoked. She has never used smokeless tobacco. She reports that she drinks about 4.2 oz of alcohol per week. She reports that she does not use illicit drugs.  Past Medical History  Diagnosis Date  . Breast cancer ([place])   . Osteopenia   . Atrophic vaginitis   . Vitamin D deficiency     Past Surgical History  Procedure Laterality Date  . Breast lumpectomy Left 07/16/2003  . Sentinel lymph node biopsy Left 07/16/03  . Breast biopsy Left 2000  . Tonsillectomy  as child  . Wisdom tooth extraction  in college  . Pilonidal cyst excision  age 59  . Breast cyst aspiration Left 1995  . Cataract extraction, bilateral  03/2014, 04/2014    Current Outpatient Prescriptions  Medication Sig Dispense Refill  . calcium-vitamin D (OSCAL WITH D) 500-200 MG-UNIT per tablet Take 1 tablet by mouth daily. (Patient not taking: Reported on 03/11/2015) 30 tablet 3  . Estradiol (VAGIFEM) 10 MCG TABS vaginal tablet USE TWICE WEEKLY 24 tablet 4   No current facility-administered medications for this visit.    Family History  Problem Relation Age of Onset  . Lung cancer Father 52  . Cervical cancer Paternal Aunt   . Stomach cancer Maternal Grandmother    . Heart disease Mother     ROS:  Pertinent items are noted in HPI.  Otherwise, a comprehensive ROS was negative.  Exam:   BP 120/82 mmHg  Pulse 88  Ht 5\' 5"  (1.651 m)  Wt 123 lb (55.792 kg)  BMI 20.47 kg/m2 Height: 5\' 5"  (165.1 cm) Ht Readings from Last 3 Encounters:  03/11/15 5\' 5"  (1.651 m)  11/06/13 5' 5.25" (1.657 m)  04/02/13 5' 5.25" (1.657 m)    General appearance: alert, cooperative and appears stated age Head: Normocephalic, without obvious abnormality, atraumatic Neck: no adenopathy, supple, symmetrical, trachea midline and thyroid normal to inspection and palpation Lungs: clear to auscultation bilaterally Breasts: normal appearance, no masses or tenderness, on the right.  Left Breast with surgical and radiation changes and multiple telangiectasis over the left breast. Heart: regular rate and rhythm Abdomen: soft, non-tender; no masses,  no organomegaly Extremities: extremities normal, atraumatic, no cyanosis or edema Skin: Skin color, texture, turgor normal. No rashes or lesions Lymph nodes: Cervical, supraclavicular, and axillary nodes normal. No abnormal inguinal nodes palpated Neurologic: Grossly normal   Pelvic: External genitalia:  no lesions              Urethra:  normal appearing urethra with no masses, tenderness or lesions              Bartholin's and Skene's:  normal                 Vagina: normal appearing vagina with normal color and discharge, no lesions              Cervix: anteverted              Pap taken: No. Bimanual Exam:  Uterus:  normal size, contour, position, consistency, mobility, non-tender              Adnexa: no mass, fullness, tenderness               Rectovaginal: Confirms               Anus:  normal sphincter tone, no lesions  Chaperone present: no  A:  Well Woman with normal exam  S/P left breast cancer with lumpectomy, chemo and radiation 2/06  Postmenopausal  Atrophic vaginitis on Vagifem - OK 'd by  Oncologist  Osteopenia on Fosamax since about 2008 now off since 11/2013 Vit D deficiency  P:   Reviewed health and wellness pertinent to exam  Pap smear as above  Mammogram is due 07/2015  Will follow with labs  Refill on Vagifem for a year  Counseled with risk of DVT, CVA, cancer, etc.  Counseled on breast self exam, mammography screening, use and side effects of HRT, osteoporosis, adequate intake of calcium and vitamin D, diet and exercise, Kegel's exercises return annually or prn  An After Visit Summary was printed and given to the patient.

## 2015-03-16 NOTE — Addendum Note (Signed)
Addended by: Antonietta Barcelona on: 03/16/2015 02:46 PM   Modules accepted: Orders

## 2015-04-08 LAB — FECAL OCCULT BLOOD, IMMUNOCHEMICAL: IMMUNOLOGICAL FECAL OCCULT BLOOD TEST: NEGATIVE

## 2015-04-08 NOTE — Addendum Note (Signed)
Addended by: Graylon Good on: 04/08/2015 10:53 AM   Modules accepted: Orders

## 2015-08-26 ENCOUNTER — Telehealth: Payer: Self-pay | Admitting: Nurse Practitioner

## 2015-08-26 NOTE — Telephone Encounter (Deleted)
Correction of FRAX Score for right hip it is 1.2% and left hip is 0.5%.

## 2015-08-26 NOTE — Telephone Encounter (Addendum)
Please let pt know that BMD done on 08/18/15 shows a T Score at the spine of -1.50; right hip neck -1.40; left hip neck -0.40.  All sites fall into the low normal or borderline Osteopenic range.  Comparison to previous study in 07/2013 shows a loss at the spine of 6%, an increase at the hip by 11 & 17%.  The FRAX score for a major fracture in 10 yrs is 6.7% (goal is <20%); the FRAX score for left hip fracture in 10 yrs is 0.5% and right hip fracture is 1.2% (goal is <3%).  Diana Martin has done a good job at her walking exercise just needs to be sure and do upper body weights.  Diana Martin needs to maintain calcium and Vit D.  Recheck again in about 2 yrs.

## 2015-08-28 NOTE — Telephone Encounter (Signed)
Left message for patient to call back. -sco

## 2015-09-01 NOTE — Telephone Encounter (Signed)
I can send them. I just want to confirm you do not need to speak with the patient as well. Please let me know.  Thanks, Lavella Hammock

## 2015-09-01 NOTE — Telephone Encounter (Signed)
No I do not. I was just calling letting her know about her BMD results. Thank you.

## 2015-09-01 NOTE — Telephone Encounter (Signed)
Patient returned call. She said, "Can the medical assistant just send me my results by mail or do I really need to speak with her?"   I did try to get another medical assistant to speak with the patient while I had her on the phone to no avail.

## 2015-09-01 NOTE — Telephone Encounter (Signed)
Patient can have these results mailed to her per [facility]. Can you please send or do I need to do this? Please let me know. Thanks.

## 2015-09-03 NOTE — Telephone Encounter (Signed)
BMD results mailed to patient per patient request. Please close encounter if no follow up is needed.

## 2015-09-16 ENCOUNTER — Encounter: Payer: Self-pay | Admitting: Nurse Practitioner

## 2016-03-15 ENCOUNTER — Encounter: Payer: Self-pay | Admitting: Nurse Practitioner

## 2016-03-15 NOTE — Progress Notes (Signed)
Patient ID: Diana Martin, female   DOB: [birth year], 71 y.o.   MRN: [id number]  71 y.o. G0P0000 Married  Caucasian Fe here for annual exam.  Pain right hip X 6 months - atraumatic.  Walks daily outdoors.  May need repair of the labrum of hip but going to try PT first. She feels well without any other health problem.  Patient's last menstrual period was 01/12/1996 (approximate).          Sexually active: No.  The current method of family planning is post menopausal status.    Exercising: Yes.    walking and weights every day Smoker:  no  Health Maintenance: Pap: 11/06/13, Negative  09/23/09, Negative MMG: 08/18/15, 3D, Bi-Rads 2: benign findings Colonoscopy: 05/06/08, originally 5 year recall due to breast cancer, now repeat in 10 years (we give IFOB) BMD: 08/18/15 T Score, -1.50 Spine / -1.40 Right Femur Neck / -0.40 Left Femur Neck TDaP: 08/2009 Shingles: done Pneumonia: 09/06/13 Prevnar-13, 11/07/2015 Pneumovax Hep C: 03/11/15 Labs: PCP takes care of all labs   reports that she has never smoked. She has never used smokeless tobacco. She reports that she drinks about 4.2 oz of alcohol per week . She reports that she does not use drugs.  Past Medical History:  Diagnosis Date  . Atrophic vaginitis   . Breast cancer ([place])   . Osteopenia   . Vitamin D deficiency     Past Surgical History:  Procedure Laterality Date  . BREAST BIOPSY Left 2000  . BREAST CYST ASPIRATION Left 1995  . BREAST LUMPECTOMY Left 07/16/2003  . CATARACT EXTRACTION, BILATERAL  03/2014, 04/2014  . PILONIDAL CYST EXCISION  age 37  . SENTINEL LYMPH NODE BIOPSY Left 07/16/03  . TONSILLECTOMY  as child  . WISDOM TOOTH EXTRACTION  in college    Current Outpatient Prescriptions  Medication Sig Dispense Refill  . calcium-vitamin D (OSCAL WITH D) 500-200 MG-UNIT per tablet Take 1 tablet by mouth daily. 30 tablet 3  . Estradiol (VAGIFEM) 10 MCG TABS vaginal tablet USE TWICE WEEKLY 24 tablet 4  . Multiple Vitamin  (MULTIVITAMIN) tablet Take 1 tablet by mouth daily.     No current facility-administered medications for this visit.     Family History  Problem Relation Age of Onset  . Lung cancer Father 21  . Heart disease Mother   . Cervical cancer Paternal Aunt   . Stomach cancer Maternal Grandmother     ROS:  Pertinent items are noted in HPI.  Otherwise, a comprehensive ROS was negative.  Exam:   BP 110/74 (BP Location: Right Arm, Patient Position: Sitting, Cuff Size: Normal)   Pulse 80   Ht 5\' 6"  (1.676 m)   Wt 121 lb (54.9 kg)   LMP 01/12/1996 (Approximate)   BMI 19.53 kg/m  Height: 5\' 6"  (167.6 cm) Ht Readings from Last 3 Encounters:  03/16/16 5\' 6"  (1.676 m)  03/11/15 5\' 5"  (1.651 m)  11/06/13 5' 5.25" (1.657 m)    General appearance: alert, cooperative and appears stated age Head: Normocephalic, without obvious abnormality, atraumatic Neck: no adenopathy, supple, symmetrical, trachea midline and thyroid normal to inspection and palpation Lungs: clear to auscultation bilaterally Breasts: normal appearance, no masses or tenderness, on the right.  The left breast with surgical and radiation changes.  multiple telangiectasis over left breast. Heart: regular rate and rhythm Abdomen: soft, non-tender; no masses,  no organomegaly Extremities: extremities normal, atraumatic, no cyanosis or edema Skin: Skin color, texture, turgor normal. No rashes  or lesions Lymph nodes: Cervical, supraclavicular, and axillary nodes normal. No abnormal inguinal nodes palpated Neurologic: Grossly normal   Pelvic: External genitalia:  no lesions              Urethra:  normal appearing urethra with no masses, tenderness or lesions              Bartholin's and Skene's: normal                 Vagina: normal appearing vagina with normal color and discharge, no lesions              Cervix: anteverted              Pap taken: Yes.   Bimanual Exam:  Uterus:  normal size, contour, position, consistency,  mobility, non-tender              Adnexa: no mass, fullness, tenderness               Rectovaginal: Confirms               Anus:  normal sphincter tone, no lesions  Chaperone present: yes  A:  Well Woman with normal exam    S/P left breast cancer with lumpectomy, chemo and radiation 2/06  Postmenopausal  Atrophic vaginitis on Vagifem - OK 'd by Oncologist  Osteopenia on Fosamax since about 2008 now off since 11/2013 - BMD is stable Vit D deficiency   P:   Reviewed health and wellness pertinent to exam  Pap smear was done  Mammogram is due 08/2016  Refill on Vagifem twice a week  Counseled  with risk of DVT, CVA, cancer, etc.  IFOB is given  Labs from PCP is reviewed - she will add OTC Vit D 1000 IU daily (level is 36)  Counseled on breast self exam, mammography screening, use and side effects of HRT, adequate intake of calcium and vitamin D, diet and exercise, Kegel's exercises return annually or prn  An After Visit Summary was printed and given to the patient.

## 2016-03-16 ENCOUNTER — Encounter: Payer: Self-pay | Admitting: Nurse Practitioner

## 2016-03-16 ENCOUNTER — Ambulatory Visit (INDEPENDENT_AMBULATORY_CARE_PROVIDER_SITE_OTHER): Payer: Medicare Other | Admitting: Nurse Practitioner

## 2016-03-16 VITALS — BP 110/74 | HR 80 | Ht 66.0 in | Wt 121.0 lb

## 2016-03-16 DIAGNOSIS — Z170 Estrogen receptor positive status [ER+]: Secondary | ICD-10-CM

## 2016-03-16 DIAGNOSIS — N952 Postmenopausal atrophic vaginitis: Secondary | ICD-10-CM | POA: Diagnosis not present

## 2016-03-16 DIAGNOSIS — C50512 Malignant neoplasm of lower-outer quadrant of left female breast: Secondary | ICD-10-CM | POA: Diagnosis not present

## 2016-03-16 DIAGNOSIS — M8588 Other specified disorders of bone density and structure, other site: Secondary | ICD-10-CM

## 2016-03-16 DIAGNOSIS — Z1211 Encounter for screening for malignant neoplasm of colon: Secondary | ICD-10-CM

## 2016-03-16 DIAGNOSIS — Z0000 Encounter for general adult medical examination without abnormal findings: Secondary | ICD-10-CM

## 2016-03-16 DIAGNOSIS — Z01411 Encounter for gynecological examination (general) (routine) with abnormal findings: Secondary | ICD-10-CM | POA: Diagnosis not present

## 2016-03-16 MED ORDER — ESTRADIOL 10 MCG VA TABS: ORAL_TABLET | VAGINAL | 4 refills | Status: DC

## 2016-03-16 NOTE — Patient Instructions (Addendum)

## 2016-03-18 LAB — IPS PAP TEST WITH HPV

## 2016-03-22 NOTE — Progress Notes (Signed)
Encounter reviewed by Dr. Brook Amundson C. Silva.  

## 2016-04-01 LAB — FECAL OCCULT BLOOD, IMMUNOCHEMICAL: IFOBT: NEGATIVE

## 2016-04-01 NOTE — Addendum Note (Signed)
Addended by: Terence Lux A on: 04/01/2016 10:23 AM   Modules accepted: Orders

## 2016-05-03 NOTE — Progress Notes (Signed)
Please place orders in EPIC as patient is being scheduled for a Pre-op appointment! Thank you! 

## 2016-05-07 ENCOUNTER — Other Ambulatory Visit: Payer: Self-pay | Admitting: Orthopedic Surgery

## 2016-05-11 ENCOUNTER — Telehealth: Payer: Self-pay

## 2016-05-11 NOTE — Telephone Encounter (Signed)
SENT NOTES TO SCHEDULING 

## 2016-05-13 NOTE — H&P (Signed)
TOTAL HIP ADMISSION H&P  Patient is admitted for right total hip arthroplasty, anterior approach.  Subjective:  Chief Complaint:     Right hip primary OA / pain  HPI: Diana Martin, 71 y.o. female, has a history of pain and functional disability in the right hip(s) due to arthritis and patient has failed non-surgical conservative treatments for greater than 12 weeks to include NSAID's and/or analgesics, corticosteriod injections, supervised PT with diminished ADL's post treatment and activity modification.  Onset of symptoms was gradual starting 9 months ago with rapidlly worsening course since that time.The patient noted no past surgery on the right hip(s).  Patient currently rates pain in the right hip at 8 out of 10 with activity. Patient has night pain, worsening of pain with activity and weight bearing, trendelenberg gait, pain that interfers with activities of daily living and pain with passive range of motion. Patient has evidence of periarticular osteophytes and joint space narrowing by imaging studies. This condition presents safety issues increasing the risk of falls.  There is no current active infection.   Risks, benefits and expectations were discussed with the patient.  Risks including but not limited to the risk of anesthesia, blood clots, nerve damage, blood vessel damage, failure of the prosthesis, infection and up to and including death.  Patient understand the risks, benefits and expectations and wishes to proceed with surgery.   PCP: Gerrit Heck, MD  D/C Plans:       Home   Post-op Meds:       No Rx given  Tranexamic Acid:      To be given - IV  Decadron:      Is to be given  FYI:     ASA  Norco  DME:   Pt already has equipment  PT:   No PT    Patient Active Problem List   Diagnosis Date Noted  . Osteopenia 03/11/2015  . Postmenopausal atrophic vaginitis 03/11/2015  . SPRAIN AND STRAIN OF RIBS 01/09/2008  . ANKLE INJURY, LEFT 01/09/2008  . BREAST  CANCER, HX OF 09/06/2006   Past Medical History:  Diagnosis Date  . Atrophic vaginitis   . Breast cancer ([place])   . Osteopenia   . Vitamin D deficiency     Past Surgical History:  Procedure Laterality Date  . BREAST BIOPSY Left 2000  . BREAST CYST ASPIRATION Left 1995  . BREAST LUMPECTOMY Left 07/16/2003  . CATARACT EXTRACTION, BILATERAL  03/2014, 04/2014  . PILONIDAL CYST EXCISION  age 89  . SENTINEL LYMPH NODE BIOPSY Left 07/16/03  . TONSILLECTOMY  as child  . WISDOM TOOTH EXTRACTION  in college    No prescriptions prior to admission.   No Known Allergies   Social History  Substance Use Topics  . Smoking status: Never Smoker  . Smokeless tobacco: Never Used  . Alcohol use 4.2 oz/week    7 Glasses of wine per week    Family History  Problem Relation Age of Onset  . Lung cancer Father 9  . Heart disease Mother   . Cervical cancer Paternal Aunt   . Stomach cancer Maternal Grandmother      Review of Systems  Constitutional: Negative.   HENT: Negative.   Eyes: Negative.   Respiratory: Negative.   Cardiovascular: Negative.   Gastrointestinal: Negative.   Genitourinary: Negative.   Musculoskeletal: Positive for joint pain.  Skin: Negative.   Neurological: Negative.   Endo/Heme/Allergies: Negative.   Psychiatric/Behavioral: Negative.     Objective:  Physical Exam  Constitutional: She is oriented to person, place, and time. She appears well-developed.  HENT:  Head: Normocephalic.  Eyes: Pupils are equal, round, and reactive to light.  Neck: Neck supple. No JVD present. No tracheal deviation present. No thyromegaly present.  Cardiovascular: Normal rate, regular rhythm and intact distal pulses.   Murmur heard. Respiratory: Effort normal and breath sounds normal. No respiratory distress. She has no wheezes.  GI: Soft. There is no tenderness. There is no guarding.  Musculoskeletal:       Right hip: She exhibits decreased range of motion, decreased strength,  tenderness and bony tenderness. She exhibits no swelling, no deformity and no laceration.  Lymphadenopathy:    She has no cervical adenopathy.  Neurological: She is alert and oriented to person, place, and time.  Skin: Skin is warm and dry.  Psychiatric: She has a normal mood and affect.      Labs:  Estimated body mass index is 19.53 kg/m as calculated from the following:   Height as of 03/16/16: 5\' 6"  (1.676 m).   Weight as of 03/16/16: 54.9 kg (121 lb).   Imaging Review Plain radiographs demonstrate severe degenerative joint disease of the right hip(s). The bone quality appears to be good for age and reported activity level.  Assessment/Plan:  End stage arthritis, right hip(s)  The patient history, physical examination, clinical judgement of the provider and imaging studies are consistent with end stage degenerative joint disease of the right hip(s) and total hip arthroplasty is deemed medically necessary. The treatment options including medical management, injection therapy, arthroscopy and arthroplasty were discussed at length. The risks and benefits of total hip arthroplasty were presented and reviewed. The risks due to aseptic loosening, infection, stiffness, dislocation/subluxation,  thromboembolic complications and other imponderables were discussed.  The patient acknowledged the explanation, agreed to proceed with the plan and consent was signed. Patient is being admitted for inpatient treatment for surgery, pain control, PT, OT, prophylactic antibiotics, VTE prophylaxis, progressive ambulation and ADL's and discharge planning.The patient is planning to be discharged home.     West Pugh Corben Auzenne   PA-C  05/13/2016, 2:48 PM

## 2016-05-19 ENCOUNTER — Other Ambulatory Visit (HOSPITAL_COMMUNITY): Payer: Self-pay | Admitting: Emergency Medicine

## 2016-05-19 NOTE — Patient Instructions (Signed)
Diana Martin  05/19/2016   Your procedure is scheduled on: 05-25-16  Report to [facility] Main  Entrance    Follow signs to Short Stay on first floor at [facility]     Call this number if you have problems the morning of surgery  [phone]   Remember: ONLY 1 PERSON MAY GO WITH YOU TO SHORT STAY TO GET  READY MORNING OF [place].  Do not eat food or drink liquids :After Midnight.     Take these medicines the morning of surgery with A SIP OF WATER: none                                You may not have any metal on your body including hair pins and              piercings  Do not wear jewelry, make-up, lotions, powders or perfumes, deodorant             Do not wear nail polish.  Do not shave  48 hours prior to surgery.     Do not bring valuables to the hospital. [place].  Contacts, dentures or bridgework may not be worn into surgery.  Leave suitcase in the car. After surgery it may be brought to your room.                Please read over the following fact sheets you were given: _____________________________________________________________________             [facility] - Preparing for Surgery Before surgery, you can play an important role.  Because skin is not sterile, your skin needs to be as free of germs as possible.  You can reduce the number of germs on your skin by washing with CHG (chlorahexidine gluconate) soap before surgery.  CHG is an antiseptic cleaner which kills germs and bonds with the skin to continue killing germs even after washing. Please DO NOT use if you have an allergy to CHG or antibacterial soaps.  If your skin becomes reddened/irritated stop using the CHG and inform your nurse when you arrive at Short Stay. Do not shave (including legs and underarms) for at least 48 hours prior to the first CHG shower.  You may shave your face/neck. Please follow these instructions  carefully:  1.  Shower with CHG Soap the night before surgery and the  morning of Surgery.  2.  If you choose to wash your hair, wash your hair first as usual with your  normal  shampoo.  3.  After you shampoo, rinse your hair and body thoroughly to remove the  shampoo.                           4.  Use CHG as you would any other liquid soap.  You can apply chg directly  to the skin and wash                       Gently with a scrungie or clean washcloth.  5.  Apply the CHG Soap to your body ONLY FROM THE NECK DOWN.   Do not use on face/ open  Wound or open sores. Avoid contact with eyes, ears mouth and genitals (private parts).                       Wash face,  Genitals (private parts) with your normal soap.             6.  Wash thoroughly, paying special attention to the area where your surgery  will be performed.  7.  Thoroughly rinse your body with warm water from the neck down.  8.  DO NOT shower/wash with your normal soap after using and rinsing off  the CHG Soap.                9.  Pat yourself dry with a clean towel.            10.  Wear clean pajamas.            11.  Place clean sheets on your bed the night of your first shower and do not  sleep with pets. Day of Surgery : Do not apply any lotions/deodorants the morning of surgery.  Please wear clean clothes to the hospital/surgery center.  FAILURE TO FOLLOW THESE INSTRUCTIONS MAY RESULT IN THE CANCELLATION OF YOUR SURGERY PATIENT SIGNATURE_________________________________  NURSE SIGNATURE__________________________________  ________________________________________________________________________   Adam Phenix  An incentive spirometer is a tool that can help keep your lungs clear and active. This tool measures how well you are filling your lungs with each breath. Taking long deep breaths may help reverse or decrease the chance of developing breathing (pulmonary) problems (especially infection)  following:  A long period of time when you are unable to move or be active. BEFORE THE PROCEDURE   If the spirometer includes an indicator to show your best effort, your nurse or respiratory therapist will set it to a desired goal.  If possible, sit up straight or lean slightly forward. Try not to slouch.  Hold the incentive spirometer in an upright position. INSTRUCTIONS FOR USE  1. Sit on the edge of your bed if possible, or sit up as far as you can in bed or on a chair. 2. Hold the incentive spirometer in an upright position. 3. Breathe out normally. 4. Place the mouthpiece in your mouth and seal your lips tightly around it. 5. Breathe in slowly and as deeply as possible, raising the piston or the ball toward the top of the column. 6. Hold your breath for 3-5 seconds or for as long as possible. Allow the piston or ball to fall to the bottom of the column. 7. Remove the mouthpiece from your mouth and breathe out normally. 8. Rest for a few seconds and repeat Steps 1 through 7 at least 10 times every 1-2 hours when you are awake. Take your time and take a few normal breaths between deep breaths. 9. The spirometer may include an indicator to show your best effort. Use the indicator as a goal to work toward during each repetition. 10. After each set of 10 deep breaths, practice coughing to be sure your lungs are clear. If you have an incision (the cut made at the time of surgery), support your incision when coughing by placing a pillow or rolled up towels firmly against it. Once you are able to get out of bed, walk around indoors and cough well. You may stop using the incentive spirometer when instructed by your caregiver.  RISKS AND COMPLICATIONS  Take your time so you do not get  dizzy or light-headed.  If you are in pain, you may need to take or ask for pain medication before doing incentive spirometry. It is harder to take a deep breath if you are having pain. AFTER USE  Rest and  breathe slowly and easily.  It can be helpful to keep track of a log of your progress. Your caregiver can provide you with a simple table to help with this. If you are using the spirometer at home, follow these instructions: [place] IF:   You are having difficultly using the spirometer.  You have trouble using the spirometer as often as instructed.  Your pain medication is not giving enough relief while using the spirometer.  You develop fever of 100.5 F (38.1 C) or higher. SEEK IMMEDIATE MEDICAL CARE IF:   You cough up bloody sputum that had not been present before.  You develop fever of 102 F (38.9 C) or greater.  You develop worsening pain at or near the incision site. MAKE SURE YOU:   Understand these instructions.  Will watch your condition.  Will get help right away if you are not doing well or get worse. Document Released: 05/10/2006 Document Revised: 03/22/2011 Document Reviewed: 07/11/2006 ExitCare Patient Information 2014 Exit[employer], [place].   ________________________________________________________________________  WHAT IS A BLOOD TRANSFUSION? Blood Transfusion Information  A transfusion is the replacement of blood or some of its parts. Blood is made up of multiple cells which provide different functions.  Red blood cells carry oxygen and are used for blood loss replacement.  White blood cells fight against infection.  Platelets control bleeding.  Plasma helps clot blood.  Other blood products are available for specialized needs, such as hemophilia or other clotting disorders. BEFORE THE TRANSFUSION  Who gives blood for transfusions?   Healthy volunteers who are fully evaluated to make sure their blood is safe. This is blood bank blood. Transfusion therapy is the safest it has ever been in the practice of medicine. Before blood is taken from a donor, a complete history is taken to make sure that person has no history of diseases nor engages in  risky social behavior (examples are intravenous drug use or sexual activity with multiple partners). The donor's travel history is screened to minimize risk of transmitting infections, such as malaria. The donated blood is tested for signs of infectious diseases, such as HIV and hepatitis. The blood is then tested to be sure it is compatible with you in order to minimize the chance of a transfusion reaction. If you or a relative donates blood, this is often done in anticipation of surgery and is not appropriate for emergency situations. It takes many days to process the donated blood. RISKS AND COMPLICATIONS Although transfusion therapy is very safe and saves many lives, the main dangers of transfusion include:   Getting an infectious disease.  Developing a transfusion reaction. This is an allergic reaction to something in the blood you were given. Every precaution is taken to prevent this. The decision to have a blood transfusion has been considered carefully by your caregiver before blood is given. Blood is not given unless the benefits outweigh the risks. AFTER THE TRANSFUSION  Right after receiving a blood transfusion, you will usually feel much better and more energetic. This is especially true if your red blood cells have gotten low (anemic). The transfusion raises the level of the red blood cells which carry oxygen, and this usually causes an energy increase.  The nurse administering the transfusion will  monitor you carefully for complications. HOME CARE INSTRUCTIONS  No special instructions are needed after a transfusion. You may find your energy is better. Speak with your caregiver about any limitations on activity for underlying diseases you may have. SEEK MEDICAL CARE IF:   Your condition is not improving after your transfusion.  You develop redness or irritation at the intravenous (IV) site. SEEK IMMEDIATE MEDICAL CARE IF:  Any of the following symptoms occur over the next 12  hours:  Shaking chills.  You have a temperature by mouth above 102 F (38.9 C), not controlled by medicine.  Chest, back, or muscle pain.  People around you feel you are not acting correctly or are confused.  Shortness of breath or difficulty breathing.  Dizziness and fainting.  You get a rash or develop hives.  You have a decrease in urine output.  Your urine turns a dark color or changes to pink, red, or brown. Any of the following symptoms occur over the next 10 days:  You have a temperature by mouth above 102 F (38.9 C), not controlled by medicine.  Shortness of breath.  Weakness after normal activity.  The white part of the eye turns yellow (jaundice).  You have a decrease in the amount of urine or are urinating less often.  Your urine turns a dark color or changes to pink, red, or brown. Document Released: 12/26/1999 Document Revised: 03/22/2011 Document Reviewed: 08/14/2007 [facility] Patient Information 2014 [place], [place].  _______________________________________________________________________

## 2016-05-19 NOTE — Progress Notes (Signed)
lov/ cardiology clearance Dr Wynonia Lawman 05-11-16 on chart

## 2016-05-20 ENCOUNTER — Encounter (HOSPITAL_COMMUNITY)
Admission: RE | Admit: 2016-05-20 | Discharge: 2016-05-20 | Disposition: A | Discharge status: Home / Self Care | Payer: Medicare Other | Source: Ambulatory Visit | Attending: Orthopedic Surgery | Admitting: Orthopedic Surgery

## 2016-05-20 ENCOUNTER — Encounter (HOSPITAL_COMMUNITY): Payer: Self-pay

## 2016-05-20 DIAGNOSIS — M1611 Unilateral primary osteoarthritis, right hip: Secondary | ICD-10-CM | POA: Diagnosis not present

## 2016-05-20 DIAGNOSIS — Z01812 Encounter for preprocedural laboratory examination: Secondary | ICD-10-CM | POA: Diagnosis not present

## 2016-05-20 DIAGNOSIS — Z01818 Encounter for other preprocedural examination: Secondary | ICD-10-CM | POA: Diagnosis present

## 2016-05-20 HISTORY — DX: Unspecified osteoarthritis, unspecified site: M19.90

## 2016-05-20 LAB — CBC
HEMATOCRIT: 37.6 % (ref 36.0–46.0)
Hemoglobin: 12.5 g/dL (ref 12.0–15.0)
MCH: 31.1 pg (ref 26.0–34.0)
MCHC: 33.2 g/dL (ref 30.0–36.0)
MCV: 93.5 fL (ref 78.0–100.0)
Platelets: 215 10*3/uL (ref 150–400)
RBC: 4.02 MIL/uL (ref 3.87–5.11)
RDW: 12.8 % (ref 11.5–15.5)
WBC: 5.6 10*3/uL (ref 4.0–10.5)

## 2016-05-20 LAB — BASIC METABOLIC PANEL
ANION GAP: 6 (ref 5–15)
BUN: 18 mg/dL (ref 6–20)
CALCIUM: 9.5 mg/dL (ref 8.9–10.3)
CO2: 26 mmol/L (ref 22–32)
Chloride: 106 mmol/L (ref 101–111)
Creatinine, Ser: 0.78 mg/dL (ref 0.44–1.00)
GLUCOSE: 132 mg/dL — AB (ref 65–99)
POTASSIUM: 3.9 mmol/L (ref 3.5–5.1)
Sodium: 138 mmol/L (ref 135–145)

## 2016-05-20 LAB — SURGICAL PCR SCREEN
MRSA, PCR: NEGATIVE
Staphylococcus aureus: POSITIVE — AB

## 2016-05-20 LAB — ABO/RH: ABO/RH(D): A POS

## 2016-05-20 NOTE — Progress Notes (Signed)
Spoke with Tammy at [facility]; requested pt EKG; she said she will have it faxed as soon as she can.

## 2016-05-25 ENCOUNTER — Inpatient Hospital Stay (HOSPITAL_COMMUNITY): Payer: Medicare Other

## 2016-05-25 ENCOUNTER — Encounter (HOSPITAL_COMMUNITY): Payer: Self-pay | Admitting: *Deleted

## 2016-05-25 ENCOUNTER — Inpatient Hospital Stay (HOSPITAL_COMMUNITY): Payer: Medicare Other | Admitting: Anesthesiology

## 2016-05-25 ENCOUNTER — Encounter (HOSPITAL_COMMUNITY)
Admission: RE | Disposition: A | Discharge status: Home / Self Care | Payer: Self-pay | Source: Ambulatory Visit | Attending: Orthopedic Surgery

## 2016-05-25 ENCOUNTER — Inpatient Hospital Stay (HOSPITAL_COMMUNITY)
Admission: RE | Admit: 2016-05-25 | Discharge: 2016-05-26 | DRG: 470 | Disposition: A | Discharge status: Home / Self Care | Payer: Medicare Other | Source: Ambulatory Visit | Attending: Orthopedic Surgery | Admitting: Orthopedic Surgery

## 2016-05-25 DIAGNOSIS — Z853 Personal history of malignant neoplasm of breast: Secondary | ICD-10-CM | POA: Diagnosis not present

## 2016-05-25 DIAGNOSIS — M8580 Other specified disorders of bone density and structure, unspecified site: Secondary | ICD-10-CM | POA: Diagnosis present

## 2016-05-25 DIAGNOSIS — E559 Vitamin D deficiency, unspecified: Secondary | ICD-10-CM | POA: Diagnosis present

## 2016-05-25 DIAGNOSIS — Z96649 Presence of unspecified artificial hip joint: Secondary | ICD-10-CM

## 2016-05-25 DIAGNOSIS — Z96641 Presence of right artificial hip joint: Secondary | ICD-10-CM

## 2016-05-25 DIAGNOSIS — M1611 Unilateral primary osteoarthritis, right hip: Secondary | ICD-10-CM | POA: Diagnosis present

## 2016-05-25 HISTORY — PX: TOTAL HIP ARTHROPLASTY: SHX124

## 2016-05-25 LAB — TYPE AND SCREEN
ABO/RH(D): A POS
Antibody Screen: NEGATIVE

## 2016-05-25 SURGERY — ARTHROPLASTY, HIP, TOTAL, ANTERIOR APPROACH
Anesthesia: Monitor Anesthesia Care | Site: Hip | Laterality: Right

## 2016-05-25 MED ORDER — SODIUM CHLORIDE 0.9 % IV SOLN
INTRAVENOUS | Status: DC
  Administered 2016-05-25: 100 mL/h via INTRAVENOUS

## 2016-05-25 MED ORDER — ONDANSETRON HCL 4 MG/2ML IJ SOLN: 4.0000 mg | Freq: Four times a day (QID) | INTRAMUSCULAR | Status: DC | PRN

## 2016-05-25 MED ORDER — PROPOFOL 10 MG/ML IV BOLUS
INTRAVENOUS | Status: AC
  Filled 2016-05-25: qty 60

## 2016-05-25 MED ORDER — ALUM & MAG HYDROXIDE-SIMETH 200-200-20 MG/5ML PO SUSP: 15.0000 mL | ORAL | Status: DC | PRN

## 2016-05-25 MED ORDER — ASPIRIN 81 MG PO CHEW: 81.0000 mg | CHEWABLE_TABLET | Freq: Two times a day (BID) | ORAL | 0 refills | Status: AC

## 2016-05-25 MED ORDER — ONDANSETRON HCL 4 MG/2ML IJ SOLN
INTRAMUSCULAR | Status: AC
  Filled 2016-05-25: qty 2

## 2016-05-25 MED ORDER — MAGNESIUM CITRATE PO SOLN: 1.0000 | Freq: Once | ORAL | Status: DC | PRN

## 2016-05-25 MED ORDER — PROPOFOL 500 MG/50ML IV EMUL
INTRAVENOUS | Status: DC | PRN
  Administered 2016-05-25: 100 ug/kg/min via INTRAVENOUS

## 2016-05-25 MED ORDER — CEFAZOLIN SODIUM-DEXTROSE 2-4 GM/100ML-% IV SOLN
INTRAVENOUS | Status: AC
  Administered 2016-05-25: 13:00:00 2 g via INTRAVENOUS
  Filled 2016-05-25: qty 100

## 2016-05-25 MED ORDER — METOCLOPRAMIDE HCL 5 MG PO TABS: 5.0000 mg | ORAL_TABLET | Freq: Three times a day (TID) | ORAL | Status: DC | PRN

## 2016-05-25 MED ORDER — FENTANYL CITRATE (PF) 100 MCG/2ML IJ SOLN
INTRAMUSCULAR | Status: AC
Start: 2016-05-25 — End: 2016-05-25
  Filled 2016-05-25: qty 2

## 2016-05-25 MED ORDER — PHENYLEPHRINE 40 MCG/ML (10ML) SYRINGE FOR IV PUSH (FOR BLOOD PRESSURE SUPPORT)
PREFILLED_SYRINGE | INTRAVENOUS | Status: AC
  Filled 2016-05-25: qty 10

## 2016-05-25 MED ORDER — FERROUS SULFATE 325 (65 FE) MG PO TABS: 325.0000 mg | ORAL_TABLET | Freq: Three times a day (TID) | ORAL | Status: DC

## 2016-05-25 MED ORDER — HYDROCODONE-ACETAMINOPHEN 7.5-325 MG PO TABS
1.0000 | ORAL_TABLET | ORAL | Status: DC
  Administered 2016-05-25 – 2016-05-26 (×4): 1 via ORAL
  Filled 2016-05-25 (×2): qty 1
  Filled 2016-05-25: qty 2
  Filled 2016-05-25: qty 1

## 2016-05-25 MED ORDER — CEFAZOLIN SODIUM-DEXTROSE 2-4 GM/100ML-% IV SOLN
2.0000 g | Freq: Four times a day (QID) | INTRAVENOUS | Status: AC
  Administered 2016-05-25 (×2): 2 g via INTRAVENOUS
  Filled 2016-05-25 (×2): qty 100

## 2016-05-25 MED ORDER — PHENYLEPHRINE HCL 10 MG/ML IJ SOLN
INTRAMUSCULAR | Status: DC | PRN
  Administered 2016-05-25 (×2): 80 ug via INTRAVENOUS
  Administered 2016-05-25: 120 ug via INTRAVENOUS

## 2016-05-25 MED ORDER — ONDANSETRON HCL 4 MG PO TABS: 4.0000 mg | ORAL_TABLET | Freq: Four times a day (QID) | ORAL | Status: DC | PRN

## 2016-05-25 MED ORDER — BISACODYL 10 MG RE SUPP: 10.0000 mg | Freq: Every day | RECTAL | Status: DC | PRN

## 2016-05-25 MED ORDER — DEXAMETHASONE SODIUM PHOSPHATE 10 MG/ML IJ SOLN
10.0000 mg | Freq: Once | INTRAMUSCULAR | Status: DC
  Filled 2016-05-25: qty 1

## 2016-05-25 MED ORDER — LACTATED RINGERS IV SOLN
INTRAVENOUS | Status: DC
  Administered 2016-05-25 (×3): via INTRAVENOUS

## 2016-05-25 MED ORDER — HYDROMORPHONE HCL 1 MG/ML IJ SOLN
0.2500 mg | INTRAMUSCULAR | Status: DC | PRN
  Administered 2016-05-25 (×2): 0.25 mg via INTRAVENOUS

## 2016-05-25 MED ORDER — ASPIRIN 81 MG PO CHEW
81.0000 mg | CHEWABLE_TABLET | Freq: Two times a day (BID) | ORAL | Status: DC
  Administered 2016-05-25 – 2016-05-26 (×2): 81 mg via ORAL
  Filled 2016-05-25 (×2): qty 1

## 2016-05-25 MED ORDER — METHOCARBAMOL 500 MG PO TABS
500.0000 mg | ORAL_TABLET | Freq: Four times a day (QID) | ORAL | Status: DC | PRN
Start: 2016-05-25 — End: 2016-05-26
  Administered 2016-05-25 – 2016-05-26 (×4): 500 mg via ORAL
  Filled 2016-05-25 (×4): qty 1

## 2016-05-25 MED ORDER — FENTANYL CITRATE (PF) 100 MCG/2ML IJ SOLN
INTRAMUSCULAR | Status: DC | PRN
  Administered 2016-05-25: 50 ug via INTRAVENOUS
  Administered 2016-05-25 (×2): 25 ug via INTRAVENOUS

## 2016-05-25 MED ORDER — DIPHENHYDRAMINE HCL 25 MG PO CAPS: 25.0000 mg | ORAL_CAPSULE | Freq: Four times a day (QID) | ORAL | Status: DC | PRN

## 2016-05-25 MED ORDER — SODIUM CHLORIDE 0.9 % IR SOLN
Status: DC | PRN
  Administered 2016-05-25: 1000 mL

## 2016-05-25 MED ORDER — ONDANSETRON HCL 4 MG/2ML IJ SOLN
INTRAMUSCULAR | Status: DC | PRN
  Administered 2016-05-25: 4 mg via INTRAVENOUS

## 2016-05-25 MED ORDER — MIDAZOLAM HCL 2 MG/2ML IJ SOLN
INTRAMUSCULAR | Status: AC
  Filled 2016-05-25: qty 2

## 2016-05-25 MED ORDER — CHLORHEXIDINE GLUCONATE 4 % EX LIQD: 60.0000 mL | Freq: Once | CUTANEOUS | Status: DC

## 2016-05-25 MED ORDER — POLYETHYLENE GLYCOL 3350 17 G PO PACK: 17.0000 g | PACK | Freq: Two times a day (BID) | ORAL | 0 refills | Status: DC

## 2016-05-25 MED ORDER — PHENYLEPHRINE HCL 10 MG/ML IJ SOLN
INTRAMUSCULAR | Status: AC
  Filled 2016-05-25: qty 1

## 2016-05-25 MED ORDER — DEXAMETHASONE SODIUM PHOSPHATE 10 MG/ML IJ SOLN
INTRAMUSCULAR | Status: DC | PRN
  Administered 2016-05-25: 10 mg via INTRAVENOUS

## 2016-05-25 MED ORDER — TRANEXAMIC ACID 1000 MG/10ML IV SOLN
1000.0000 mg | INTRAVENOUS | Status: AC
  Administered 2016-05-25: 1000 mg via INTRAVENOUS
  Filled 2016-05-25: qty 1100

## 2016-05-25 MED ORDER — HYDROMORPHONE HCL 1 MG/ML IJ SOLN
INTRAMUSCULAR | Status: AC
  Filled 2016-05-25: qty 0.5

## 2016-05-25 MED ORDER — DOCUSATE SODIUM 100 MG PO CAPS
100.0000 mg | ORAL_CAPSULE | Freq: Two times a day (BID) | ORAL | Status: DC
  Administered 2016-05-25 – 2016-05-26 (×2): 100 mg via ORAL
  Filled 2016-05-25 (×2): qty 1

## 2016-05-25 MED ORDER — PHENYLEPHRINE HCL 10 MG/ML IJ SOLN
INTRAVENOUS | Status: DC | PRN
  Administered 2016-05-25: 25 ug/min via INTRAVENOUS

## 2016-05-25 MED ORDER — HYDROCODONE-ACETAMINOPHEN 7.5-325 MG PO TABS: 1.0000 | ORAL_TABLET | ORAL | 0 refills | Status: DC | PRN

## 2016-05-25 MED ORDER — DOCUSATE SODIUM 100 MG PO CAPS: 100.0000 mg | ORAL_CAPSULE | Freq: Two times a day (BID) | ORAL | 0 refills | Status: DC

## 2016-05-25 MED ORDER — METHOCARBAMOL 500 MG PO TABS: 500.0000 mg | ORAL_TABLET | Freq: Four times a day (QID) | ORAL | 0 refills | Status: DC | PRN

## 2016-05-25 MED ORDER — ONDANSETRON HCL 4 MG/2ML IJ SOLN: 4.0000 mg | Freq: Once | INTRAMUSCULAR | Status: DC | PRN

## 2016-05-25 MED ORDER — METOCLOPRAMIDE HCL 5 MG/ML IJ SOLN
5.0000 mg | Freq: Three times a day (TID) | INTRAMUSCULAR | Status: DC | PRN
  Administered 2016-05-25: 10 mg via INTRAVENOUS
  Filled 2016-05-25: qty 2

## 2016-05-25 MED ORDER — TRANEXAMIC ACID 1000 MG/10ML IV SOLN
1000.0000 mg | Freq: Once | INTRAVENOUS | Status: AC
  Administered 2016-05-25: 12:00:00 1000 mg via INTRAVENOUS
  Filled 2016-05-25: qty 1100

## 2016-05-25 MED ORDER — DEXAMETHASONE SODIUM PHOSPHATE 10 MG/ML IJ SOLN
INTRAMUSCULAR | Status: AC
  Filled 2016-05-25: qty 1

## 2016-05-25 MED ORDER — METHOCARBAMOL 1000 MG/10ML IJ SOLN
500.0000 mg | Freq: Four times a day (QID) | INTRAMUSCULAR | Status: DC | PRN
  Administered 2016-05-25: 500 mg via INTRAVENOUS
  Filled 2016-05-25: qty 550

## 2016-05-25 MED ORDER — BUPIVACAINE IN DEXTROSE 0.75-8.25 % IT SOLN
INTRATHECAL | Status: DC | PRN
  Administered 2016-05-25: 2 mL via INTRATHECAL

## 2016-05-25 MED ORDER — MIDAZOLAM HCL 5 MG/5ML IJ SOLN
INTRAMUSCULAR | Status: DC | PRN
  Administered 2016-05-25: 1 mg via INTRAVENOUS

## 2016-05-25 MED ORDER — POLYETHYLENE GLYCOL 3350 17 G PO PACK: 17.0000 g | PACK | Freq: Two times a day (BID) | ORAL | Status: DC

## 2016-05-25 MED ORDER — CEFAZOLIN SODIUM-DEXTROSE 2-4 GM/100ML-% IV SOLN
2.0000 g | INTRAVENOUS | Status: AC
  Administered 2016-05-25: 2 g via INTRAVENOUS

## 2016-05-25 MED ORDER — CELECOXIB 200 MG PO CAPS
200.0000 mg | ORAL_CAPSULE | Freq: Two times a day (BID) | ORAL | Status: DC
  Administered 2016-05-25 – 2016-05-26 (×2): 200 mg via ORAL
  Filled 2016-05-25 (×3): qty 1

## 2016-05-25 MED ORDER — PHENOL 1.4 % MT LIQD
1.0000 | OROMUCOSAL | Status: DC | PRN
Start: 2016-05-25 — End: 2016-05-26

## 2016-05-25 MED ORDER — MENTHOL 3 MG MT LOZG: 1.0000 | LOZENGE | OROMUCOSAL | Status: DC | PRN

## 2016-05-25 MED ORDER — HYDROMORPHONE HCL 1 MG/ML IJ SOLN: 0.5000 mg | INTRAMUSCULAR | Status: DC | PRN

## 2016-05-25 SURGICAL SUPPLY — 37 items
ADH SKN CLS APL DERMABOND .7 (GAUZE/BANDAGES/DRESSINGS) ×1
BAG DECANTER FOR FLEXI CONT (MISCELLANEOUS) IMPLANT
BAG SPEC THK2 15X12 ZIP CLS (MISCELLANEOUS)
BAG ZIPLOCK 12X15 (MISCELLANEOUS) IMPLANT
BLADE SAG 18X100X1.27 (BLADE) ×3 IMPLANT
CAPT HIP TOTAL 2 ×2 IMPLANT
CLOTH BEACON ORANGE TIMEOUT ST (SAFETY) ×1 IMPLANT
COVER PERINEAL POST (MISCELLANEOUS) ×3 IMPLANT
COVER SURGICAL LIGHT HANDLE (MISCELLANEOUS) ×3 IMPLANT
DERMABOND ADVANCED (GAUZE/BANDAGES/DRESSINGS) ×2
DERMABOND ADVANCED .7 DNX12 (GAUZE/BANDAGES/DRESSINGS) ×1 IMPLANT
DRAPE STERI IOBAN 125X83 (DRAPES) ×3 IMPLANT
DRAPE U-SHAPE 47X51 STRL (DRAPES) ×6 IMPLANT
DRESSING AQUACEL AG SP 3.5X10 (GAUZE/BANDAGES/DRESSINGS) ×1 IMPLANT
DRSG AQUACEL AG SP 3.5X10 (GAUZE/BANDAGES/DRESSINGS) ×3
DURAPREP 26ML APPLICATOR (WOUND CARE) ×3 IMPLANT
ELECT REM PT RETURN 15FT ADLT (MISCELLANEOUS) ×3 IMPLANT
GLOVE BIOGEL M STRL SZ7.5 (GLOVE) IMPLANT
GLOVE BIOGEL PI IND STRL 7.5 (GLOVE) ×1 IMPLANT
GLOVE BIOGEL PI IND STRL 8.5 (GLOVE) ×1 IMPLANT
GLOVE BIOGEL PI INDICATOR 7.5 (GLOVE) ×10
GLOVE BIOGEL PI INDICATOR 8.5 (GLOVE) ×2
GLOVE ECLIPSE 8.0 STRL XLNG CF (GLOVE) ×6 IMPLANT
GLOVE ORTHO TXT STRL SZ7.5 (GLOVE) ×3 IMPLANT
GOWN STRL REUS W/TWL LRG LVL3 (GOWN DISPOSABLE) ×5 IMPLANT
GOWN STRL REUS W/TWL XL LVL3 (GOWN DISPOSABLE) ×5 IMPLANT
HOLDER FOLEY CATH W/STRAP (MISCELLANEOUS) ×3 IMPLANT
PACK ANTERIOR HIP CUSTOM (KITS) ×3 IMPLANT
SUT MNCRL AB 4-0 PS2 18 (SUTURE) ×3 IMPLANT
SUT STRATAFIX 0 PDS 27 VIOLET (SUTURE) ×3
SUT VIC AB 1 CT1 36 (SUTURE) ×9 IMPLANT
SUT VIC AB 2-0 CT1 27 (SUTURE) ×6
SUT VIC AB 2-0 CT1 TAPERPNT 27 (SUTURE) ×2 IMPLANT
SUTURE STRATFX 0 PDS 27 VIOLET (SUTURE) ×1 IMPLANT
TRAY FOLEY CATH 14FR (SET/KITS/TRAYS/PACK) ×2 IMPLANT
WATER STERILE IRR 1500ML POUR (IV SOLUTION) ×5 IMPLANT
YANKAUER SUCT BULB TIP 10FT TU (MISCELLANEOUS) ×2 IMPLANT

## 2016-05-25 NOTE — Anesthesia Postprocedure Evaluation (Signed)
Anesthesia Post Note  Patient: Diana Martin  Procedure(s) Performed: Procedure(s) (LRB): RIGHT TOTAL HIP ARTHROPLASTY ANTERIOR APPROACH (Right)  Patient location during evaluation: PACU Anesthesia Type: MAC and Spinal Level of consciousness: awake, awake and alert and oriented Pain management: pain level controlled Vital Signs Assessment: post-procedure vital signs reviewed and stable Respiratory status: spontaneous breathing, nonlabored ventilation and respiratory function stable Cardiovascular status: blood pressure returned to baseline Anesthetic complications: no       Last Vitals:  Vitals:   05/25/16 1315 05/25/16 1415  BP: 134/63 118/67  Pulse: 73 71  Resp: 15 16  Temp: 36.8 C 36.7 C    Last Pain:  Vitals:   05/25/16 1655  TempSrc:   PainSc: 5                  Teaira Croft COKER

## 2016-05-25 NOTE — Anesthesia Procedure Notes (Addendum)
Spinal  Start time: 05/25/2016 7:20 AM End time: 05/25/2016 7:25 AM Staffing Anesthesiologist: Linna Caprice, DAVID Performed: anesthesiologist  Preanesthetic Checklist Completed: patient identified, site marked, surgical consent, pre-op evaluation, timeout performed, IV checked, risks and benefits discussed and monitors and equipment checked Spinal Block Patient position: sitting Prep: Betadine Patient monitoring: heart rate, cardiac monitor, continuous pulse ox and blood pressure Approach: right paramedian Location: L3-4 Injection technique: single-shot Needle Needle type: Pencil-Tip  Needle gauge: 22 G Needle length: 9 cm Needle insertion depth: 7 cm Assessment Sensory level: T6 Additional Notes 15 mg 0.75% Bupivacaine injected easily.

## 2016-05-25 NOTE — Anesthesia Preprocedure Evaluation (Addendum)
Anesthesia Evaluation  Patient identified by MRN, date of birth, ID band Patient awake    Reviewed: Allergy & Precautions, NPO status , Patient's Chart, lab work & pertinent test results  Airway Mallampati: II  TM Distance: >3 FB Neck ROM: Full    Dental  (+) Teeth Intact, Dental Advisory Given   Pulmonary    breath sounds clear to auscultation       Cardiovascular  Rhythm:Regular Rate:Normal     Neuro/Psych    GI/Hepatic   Endo/Other    Renal/GU      Musculoskeletal   Abdominal   Peds  Hematology   Anesthesia Other Findings   Reproductive/Obstetrics                            Anesthesia Physical Anesthesia Plan  ASA: II  Anesthesia Plan: Spinal and MAC   Post-op Pain Management:    Induction: Intravenous  Airway Management Planned: Natural Airway and Simple Face Mask  Additional Equipment:   Intra-op Plan:   Post-operative Plan:   Informed Consent: I have reviewed the patients History and Physical, chart, labs and discussed the procedure including the risks, benefits and alternatives for the proposed anesthesia with the patient or authorized representative who has indicated his/her understanding and acceptance.     Plan Discussed with: CRNA and Anesthesiologist  Anesthesia Plan Comments:         Anesthesia Quick Evaluation

## 2016-05-25 NOTE — Progress Notes (Signed)
Portable AP Pelvis and Lateral Right Hip X-rays done. 

## 2016-05-25 NOTE — Discharge Instructions (Signed)

## 2016-05-25 NOTE — Progress Notes (Signed)
X-ray results noted 

## 2016-05-25 NOTE — Transfer of Care (Signed)
Immediate Anesthesia Transfer of Care Note  Patient: LADEANA LAPLANT  Procedure(s) Performed: Procedure(s): RIGHT TOTAL HIP ARTHROPLASTY ANTERIOR APPROACH (Right)  Patient Location: PACU  Anesthesia Type:Spinal  Level of Consciousness: awake, alert , oriented and patient cooperative  Airway & Oxygen Therapy: Patient Spontanous Breathing and Patient connected to face mask oxygen  Post-op Assessment: Report given to RN and Post -op Vital signs reviewed and stable  Post vital signs: stable  Last Vitals:  Vitals:   05/25/16 0507  BP: 127/74  Pulse: 96  Resp: 16  Temp: 37 C    Last Pain:  Vitals:   05/25/16 0507  TempSrc: Oral      Patients Stated Pain Goal: 3 (96/04/54 0981)  Complications: No apparent anesthesia complications

## 2016-05-25 NOTE — Interval H&P Note (Signed)
History and Physical Interval Note:  05/25/2016 7:05 AM  Diana Martin  has presented today for surgery, with the diagnosis of Right hip osteoarthritis  The various methods of treatment have been discussed with the patient and family. After consideration of risks, benefits and other options for treatment, the patient has consented to  Procedure(s): RIGHT TOTAL HIP ARTHROPLASTY ANTERIOR APPROACH (Right) as a surgical intervention .  The patient's history has been reviewed, patient examined, no change in status, stable for surgery.  I have reviewed the patient's chart and labs.  Questions were answered to the patient's satisfaction.     Mauri Pole

## 2016-05-25 NOTE — Evaluation (Signed)
Physical Therapy Evaluation Patient Details Name: Diana Martin MRN: [id number] DOB: June 18, 1945 Today's Date: 05/25/2016   History of Present Illness  RDATHA  Clinical Impression  The patient tolerated  Mobilizing to recliner. Did not attempt ambulation as she had been nauseated. Pt admitted with above diagnosis. Pt currently with functional limitations due to the deficits listed below (see PT Problem List). Pt will benefit from skilled PT to increase their independence and safety with mobility to allow discharge to the venue listed below.       Follow Up Recommendations Home health PT;Supervision/Assistance - 24 hour    Equipment Recommendations  None recommended by PT    Recommendations for Other Services       Precautions / Restrictions Precautions Precautions: Fall      Mobility  Bed Mobility Overal bed mobility: Needs Assistance Bed Mobility: Supine to Sit     Supine to sit: Mod assist;Min assist     General bed mobility comments: assisted moving both legs together for comfort  Transfers Overall transfer level: Needs assistance Equipment used: Rolling walker (2 wheeled) Transfers: Sit to/from [facility] Sit to Stand: Min assist Stand pivot transfers: Min assist       General transfer comment: decreased weight on the right per patient., small steps to recliner. cues for hand placement  Ambulation/Gait                Stairs            Wheelchair Mobility    Modified Rankin (Stroke Patients Only)       Balance                                             Pertinent Vitals/Pain Pain Assessment: 0-10 Pain Score: 2  Pain Location: right thigh Pain Descriptors / Indicators: Aching;Sore Pain Intervention(s): Limited activity within patient's tolerance;Monitored during session;Ice applied;Premedicated before session;Repositioned    Home Living Family/patient expects to be discharged to:: Private  residence Living Arrangements: Spouse/significant other Available Help at Discharge: Family Type of Home: House Home Access: Stairs to enter Entrance Stairs-Rails: None Technical brewer of Steps: 3 Home Layout: One level Home Equipment: Environmental consultant - 2 wheels      Prior Function Level of Independence: Independent               Hand Dominance        Extremity/Trunk Assessment   Upper Extremity Assessment Upper Extremity Assessment: Overall WFL for tasks assessed    Lower Extremity Assessment Lower Extremity Assessment: RLE deficits/detail RLE Deficits / Details: right  leg tending to internally rotate.    Cervical / Trunk Assessment Cervical / Trunk Assessment: Normal  Communication      Cognition Arousal/Alertness: Awake/alert Behavior During Therapy: WFL for tasks assessed/performed Overall Cognitive Status: Within Functional Limits for tasks assessed                                        General Comments      Exercises     Assessment/Plan    PT Assessment Patient needs continued PT services  PT Problem List Decreased strength;Decreased range of motion;Decreased activity tolerance;Decreased mobility;Decreased knowledge of precautions;Decreased safety awareness;Decreased knowledge of use of DME;Pain       PT Treatment Interventions DME  instruction;Gait training;Stair training;Functional mobility training;Therapeutic activities;Therapeutic exercise;Patient/family education    PT Goals (Current goals can be found in the Care Plan section)  Acute Rehab PT Goals Patient Stated Goal: to go to  workout PT Goal Formulation: With patient Time For Goal Achievement: 05/27/16 Potential to Achieve Goals: Good    Frequency 7X/week   Barriers to discharge        Co-evaluation               AM-PAC PT "6 Clicks" Daily Activity  Outcome Measure Difficulty turning over in bed (including adjusting bedclothes, sheets and blankets)?: A  Little Difficulty moving from lying on back to sitting on the side of the bed? : A Little Difficulty sitting down on and standing up from a chair with arms (e.g., wheelchair, bedside commode, etc,.)?: A Little Help needed moving to and from a bed to chair (including a wheelchair)?: A Little Help needed walking in hospital room?: A Little Help needed climbing 3-5 steps with a railing? : A Little 6 Click Score: 18    End of Session   Activity Tolerance: Patient tolerated treatment well Patient left: in chair;with call bell/phone within reach Nurse Communication: Mobility status PT Visit Diagnosis: Difficulty in walking, not elsewhere classified (R26.2);Pain Pain - Right/Left: Right Pain - part of body: Hip    Time: 4503-8882 PT Time Calculation (min) (ACUTE ONLY): 18 min   Charges:   PT Evaluation $PT Eval Low Complexity: 1 Procedure     PT G CodesClaretha Cooper 05/25/2016, 5:58 PM

## 2016-05-25 NOTE — Op Note (Signed)
NAME:  Diana Martin                ACCOUNT NO.: [id number]      MEDICAL RECORD NO.: [id number]      FACILITY:  [facility]      PHYSICIAN:  Paralee Cancel D  DATE OF BIRTH:  1945-09-08     DATE OF PROCEDURE:  05/25/2016                                 OPERATIVE REPORT         PREOPERATIVE DIAGNOSIS: Right  hip osteoarthritis.      POSTOPERATIVE DIAGNOSIS:  Right hip osteoarthritis.      PROCEDURE:  Right total hip replacement through an anterior approach   utilizing DePuy THR system, component size 46mm pinnacle cup, a size 32+4 neutral   Altrex liner, a size 4 Hi Tri Lock stem with a 32+1 delta ceramic   ball.      SURGEON:  Pietro Cassis. Alvan Dame, M.D.      ASSISTANT:  Danae Orleans, PA-C     ANESTHESIA:  Spinal.      SPECIMENS:  None.      COMPLICATIONS:  None.      BLOOD LOSS:  325 cc     DRAINS:  None.      INDICATION OF THE PROCEDURE:  NATLIE ASFOUR is a 71 y.o. female who had   presented to office for evaluation of right hip pain.  Radiographs revealed   progressive degenerative changes with bone-on-bone   articulation to the  hip joint.  The patient had painful limited range of   motion significantly affecting their overall quality of life.  The patient was failing to    respond to conservative measures, and at this point was ready   to proceed with more definitive measures.  The patient has noted progressive   degenerative changes in his hip, progressive problems and dysfunction   with regarding the hip prior to surgery.  Consent was obtained for   benefit of pain relief.  Specific risk of infection, DVT, component   failure, dislocation, need for revision surgery, as well discussion of   the anterior versus posterior approach were reviewed.  Consent was   obtained for benefit of anterior pain relief through an anterior   approach.      PROCEDURE IN DETAIL:  The patient was brought to operative theater.   Once adequate anesthesia,  preoperative antibiotics, 2 gm of Ancef, 1 gm of Tranexamic Acid, and 10 mg of Decadron administered.   The patient was positioned supine on the OSI Hanna table.  Once adequate   padding of boney process was carried out, we had predraped out the hip, and  used fluoroscopy to confirm orientation of the pelvis and position.      The right hip was then prepped and draped from proximal iliac crest to   mid thigh with shower curtain technique.      Time-out was performed identifying the patient, planned procedure, and   extremity.     An incision was then made 2 cm distal and lateral to the   anterior superior iliac spine extending over the orientation of the   tensor fascia lata muscle and sharp dissection was carried down to the   fascia of the muscle and protractor placed in the soft tissues.      The fascia  was then incised.  The muscle belly was identified and swept   laterally and retractor placed along the superior neck.  Following   cauterization of the circumflex vessels and removing some pericapsular   fat, a second cobra retractor was placed on the inferior neck.  A third   retractor was placed on the anterior acetabulum after elevating the   anterior rectus.  A L-capsulotomy was along the line of the   superior neck to the trochanteric fossa, then extended proximally and   distally.  Tag sutures were placed and the retractors were then placed   intracapsular.  We then identified the trochanteric fossa and   orientation of my neck cut, confirmed this radiographically   and then made a neck osteotomy with the femur on traction.  The femoral   head was removed without difficulty or complication.  Traction was let   off and retractors were placed posterior and anterior around the   acetabulum.      The labrum and foveal tissue were debrided.  I began reaming with a 59mm   reamer and reamed up to 38mm reamer with good bony bed preparation and a 88mm   cup was chosen.  The final  52mm Pinnacle cup was then impacted under fluoroscopy  to confirm the depth of penetration and orientation with respect to   abduction.  A screw was placed followed by the hole eliminator.  The final   32+4 neutral Altrex liner was impacted with good visualized rim fit.  The cup was positioned anatomically within the acetabular portion of the pelvis.      At this point, the femur was rolled at 80 degrees.  Further capsule was   released off the inferior aspect of the femoral neck.  I then   released the superior capsule proximally.  The hook was placed laterally   along the femur and elevated manually and held in position with the bed   hook.  The leg was then extended and adducted with the leg rolled to 100   degrees of external rotation.  Once the proximal femur was fully   exposed, I used a box osteotome to set orientation.  I then began   broaching with the starting chili pepper broach and passed this by hand and then broached up to 4.  With the 4 broach in place I chose a high offset neck and did several trial reductions.  The offset was appropriate, leg lengths were evaluated but will need to be matched to the contra-lateral hip at some point related to the extensive DJD present there.   Given these findings, I went ahead and dislocated the hip, repositioned all   retractors and positioned the right hip in the extended and abducted position.  The final 4 Hi Tri Lock stem was   chosen and it was impacted down to the level of neck cut.  Based on this   and the trial reduction, a 32+1 delta ceramic ball was chosen and   impacted onto a clean and dry trunnion, and the hip was reduced.  The   hip had been irrigated throughout the case again at this point.  I did   reapproximate the superior capsular leaflet to the anterior leaflet   using #1 Vicryl.  The fascia of the   tensor fascia lata muscle was then reapproximated using #1 Vicryl.  The   remaining wound was closed with 2-0 Vicryl and  running 4-0 Monocryl.   The hip  was cleaned, dried, and dressed sterilely using Dermabond and   Aquacel dressing.  She was then brought   to recovery room in stable condition tolerating the procedure well.    Danae Orleans, PA-C was present for the entirety of the case involved from   preoperative positioning, perioperative retractor management, general   facilitation of the case, as well as primary wound closure as assistant.            Pietro Cassis Alvan Dame, M.D.        05/25/2016 8:49 AM

## 2016-05-26 LAB — BASIC METABOLIC PANEL
ANION GAP: 4 — AB (ref 5–15)
BUN: 13 mg/dL (ref 6–20)
CALCIUM: 8.7 mg/dL — AB (ref 8.9–10.3)
CO2: 26 mmol/L (ref 22–32)
Chloride: 108 mmol/L (ref 101–111)
Creatinine, Ser: 0.6 mg/dL (ref 0.44–1.00)
Glucose, Bld: 111 mg/dL — ABNORMAL HIGH (ref 65–99)
Potassium: 4.1 mmol/L (ref 3.5–5.1)
SODIUM: 138 mmol/L (ref 135–145)

## 2016-05-26 LAB — CBC
HEMATOCRIT: 30.1 % — AB (ref 36.0–46.0)
Hemoglobin: 10.6 g/dL — ABNORMAL LOW (ref 12.0–15.0)
MCH: 32.4 pg (ref 26.0–34.0)
MCHC: 35.2 g/dL (ref 30.0–36.0)
MCV: 92 fL (ref 78.0–100.0)
PLATELETS: 145 10*3/uL — AB (ref 150–400)
RBC: 3.27 MIL/uL — ABNORMAL LOW (ref 3.87–5.11)
RDW: 12.8 % (ref 11.5–15.5)
WBC: 7.8 10*3/uL (ref 4.0–10.5)

## 2016-05-26 NOTE — Progress Notes (Signed)
Spoke with patient at bedside. Confirmed plan for OP PT, already arranged. Has RW and 3n1. [phone] 

## 2016-05-26 NOTE — Evaluation (Signed)
Occupational Therapy Evaluation Patient Details Name: Diana Martin MRN: [id number] DOB: 06/19/1945 Today's Date: 05/26/2016    History of Present Illness s/p R DA THA   Clinical Impression   This 71 y/o F presents with the above. At baseline Pt is independent with ADLs and functional mobility. Pt currently requires MinGuard Assist for functional mobility and ModA for LB ADLs. Pt will have assistance from spouse with ADLs PRN after discharge. Educated provided throughout session on compensatory techniques for completing ADLs and questions answered. Pt reports feeling comfortable with completing ADLs upon return home and with spouse assist. No further OT needs identified at this time. Will sign off.     Follow Up Recommendations  No OT follow up;Supervision - Intermittent    Equipment Recommendations  None recommended by OT           Precautions / Restrictions Precautions Precautions: Fall Restrictions Weight Bearing Restrictions: No Other Position/Activity Restrictions: WBAT       Mobility Bed Mobility               General bed mobility comments: OOB in recliner   Transfers Overall transfer level: Needs assistance Equipment used: Rolling walker (2 wheeled) Transfers: Sit to/from Stand Sit to Stand: Min guard              Balance Overall balance assessment: No apparent balance deficits (not formally assessed)                                         ADL either performed or assessed with clinical judgement   ADL Overall ADL's : Needs assistance/impaired Eating/Feeding: Independent;Sitting   Grooming: Wash/dry hands;Supervision/safety;Standing   Upper Body Bathing: Set up;Sitting   Lower Body Bathing: Sit to/from stand;Minimal assistance   Upper Body Dressing : Set up;Sitting   Lower Body Dressing: Moderate assistance;Sit to/from stand   Toilet Transfer: Min guard;Ambulation;Comfort height toilet;RW   Toileting- Marine scientist and Hygiene: Min guard;Sit to/from stand   Tub/ Shower Transfer: Walk-in shower;Minimal assistance;Cueing for sequencing;Ambulation;Rolling walker;Shower Scientist, research (medical) Details (indicate cue type and reason): verbal cues for sequencing  Functional mobility during ADLs: Min guard;Rolling walker General ADL Comments: educated on compensatory techniques for completing ADLs                         Pertinent Vitals/Pain Pain Assessment: Faces Faces Pain Scale: Hurts a little bit Pain Location: right thigh Pain Descriptors / Indicators: Aching;Sore Pain Intervention(s): Limited activity within patient's tolerance;Monitored during session;Ice applied     Hand Dominance     Extremity/Trunk Assessment Upper Extremity Assessment Upper Extremity Assessment: Overall WFL for tasks assessed           Communication Communication Communication: No difficulties   Cognition Arousal/Alertness: Awake/alert Behavior During Therapy: WFL for tasks assessed/performed Overall Cognitive Status: Within Functional Limits for tasks assessed                                     General Comments                  Home Living Family/patient expects to be discharged to:: Private residence Living Arrangements: Spouse/significant other Available Help at Discharge: Family Type of Home: House Home Access: Stairs to enter [employer] of Steps: 3 Entrance  Stairs-Rails: None Home Layout: One level     Bathroom Shower/Tub: Occupational psychologist: Handicapped height     Home Equipment: Environmental consultant - 2 wheels;Shower seat - built in          Prior Functioning/Environment Level of Independence: Independent                 OT Problem List: Decreased strength;Decreased activity tolerance;Decreased knowledge of use of DME or AE      OT Treatment/Interventions:      OT Goals(Current goals can be found in the care plan section)  Acute Rehab OT Goals Patient Stated Goal: to go to  workout OT Goal Formulation: With patient  OT Frequency:                               AM-PAC PT "6 Clicks" Daily Activity     Outcome Measure Help from another person eating meals?: None Help from another person taking care of personal grooming?: A Little Help from another person toileting, which includes using toliet, bedpan, or urinal?: A Little Help from another person bathing (including washing, rinsing, drying)?: A Little Help from another person to put on and taking off regular upper body clothing?: A Little Help from another person to put on and taking off regular lower body clothing?: A Lot 6 Click Score: 18   End of Session Equipment Utilized During Treatment: Gait belt;Rolling walker Nurse Communication: Mobility status  Activity Tolerance: Patient tolerated treatment well Patient left: in chair;with call bell/phone within reach  OT Visit Diagnosis: Muscle weakness (generalized) (M62.81)                Time: 0630-1601 OT Time Calculation (min): 23 min Charges:  OT General Charges $OT Visit: 1 Procedure OT Evaluation $OT Eval Low Complexity: 1 Procedure G-Codes:     Lou Cal, OT Pager [phone] 05/26/2016   Raymondo Band 05/26/2016, 1:07 PM

## 2016-05-26 NOTE — Progress Notes (Signed)
Physical Therapy Treatment Patient Details Name: Diana Martin MRN: [id number] DOB: [birth year] Today's Date: 05/26/2016    History of Present Illness s/p R DA THA    PT Comments    patient progressing well. Ready for DC   Follow Up Recommendations  No PT follow up;Supervision/Assistance - 24 hour     Equipment Recommendations  None recommended by PT    Recommendations for Other Services       Precautions / Restrictions Precautions Precautions: Fall Restrictions Weight Bearing Restrictions: No Other Position/Activity Restrictions: WBAT     Mobility  Bed Mobility   Bed Mobility: Supine to Sit;Sit to Supine     Supine to sit: Supervision Sit to supine: Supervision   General bed mobility comments: cues for use of left foot to self assist.  Transfers Overall transfer level: Needs assistance Equipment used: Rolling walker (2 wheeled) Transfers: Sit to/from Stand Sit to Stand: Min guard            Ambulation/Gait Ambulation/Gait assistance: Min guard Ambulation Distance (Feet): 300 Feet Assistive device: Rolling walker (2 wheeled) Gait Pattern/deviations: Step-to pattern;Step-through pattern;Decreased weight shift to right     General Gait Details: cues for sequence   Stairs Stairs: Yes   Stair Management: Step to pattern;Forwards Number of Stairs: 2 General stair comments: has a cabinet beside the steps. cues to [facility] spouse's hand  Wheelchair Mobility    Modified Rankin (Stroke Patients Only)       Balance Overall balance assessment: No apparent balance deficits (not formally assessed)                                          Cognition Arousal/Alertness: Awake/alert Behavior During Therapy: WFL for tasks assessed/performed Overall Cognitive Status: Within Functional Limits for tasks assessed                                        Exercises Total Joint Exercises Ankle Circles/Pumps: AROM;Both;10  reps Quad Sets: AROM;Both;10 reps Short Arc Quad: AROM;Right;10 reps Heel Slides: AROM;Right;10 reps Hip ABduction/ADduction: AROM;Right;10 reps Long Arc Quad: AROM;Right;10 reps    General Comments        Pertinent Vitals/Pain Pain Assessment: Faces Pain Score: 4  Faces Pain Scale: Hurts a little bit Pain Location: right thigh Pain Descriptors / Indicators: Aching;Sore;Stabbing Pain Intervention(s): Monitored during session;Premedicated before session;Ice applied    Home Living Family/patient expects to be discharged to:: Private residence Living Arrangements: Spouse/significant other Available Help at Discharge: Family Type of Home: House Home Access: Stairs to enter Entrance Stairs-Rails: None Home Layout: One level Home Equipment: Environmental consultant - 2 wheels;Shower seat - built in      Prior Function Level of Independence: Independent          PT Goals (current goals can now be found in the care plan section) Acute Rehab PT Goals Patient Stated Goal: to go to  workout Progress towards PT goals: Progressing toward goals    Frequency    7X/week      PT Plan Current plan remains appropriate    Co-evaluation              AM-PAC PT "6 Clicks" Daily Activity  Outcome Measure  Difficulty turning over in bed (including adjusting bedclothes, sheets and blankets)?: None Difficulty moving from lying  on back to sitting on the side of the bed? : None Difficulty sitting down on and standing up from a chair with arms (e.g., wheelchair, bedside commode, etc,.)?: None Help needed moving to and from a bed to chair (including a wheelchair)?: None Help needed walking in hospital room?: A Little Help needed climbing 3-5 steps with a railing? : A Little 6 Click Score: 22    End of Session   Activity Tolerance: Patient tolerated treatment well Patient left: in chair;with call bell/phone within reach Nurse Communication: Mobility status       Time: 4917-9150 PT Time  Calculation (min) (ACUTE ONLY): 37 min  Charges:  $Gait Training: 8-56mins            Exercises 8-22        G Codes:       [place] PT [phone]    [name] 05/26/2016, 2:31 PM

## 2016-05-26 NOTE — Progress Notes (Signed)
     Subjective: 1 Day Post-Op Procedure(s) (LRB): RIGHT TOTAL HIP ARTHROPLASTY ANTERIOR APPROACH (Right)   Patient reports pain as mild, pain controlled. No events throughout the night.  Looking forward to getting better and seeing how she will do after this surgery.  Discussed her lack of ROM with her left hip as well, but it has never given her pain.  Ready to be discharged home.   Objective:   VITALS:   Vitals:   05/26/16 0106 05/26/16 0423  BP: (!) 123/54 120/71  Pulse: 73 73  Resp: 15 15  Temp: 98.5 F (36.9 C) 97.9 F (36.6 C)    Dorsiflexion/Plantar flexion intact Incision: dressing C/D/I No cellulitis present Compartment soft  LABS  Recent Labs  05/26/16 0414  HGB 10.6*  HCT 30.1*  WBC 7.8  PLT 145*     Recent Labs  05/26/16 0414  NA 138  K 4.1  BUN 13  CREATININE 0.60  GLUCOSE 111*     Assessment/Plan: 1 Day Post-Op Procedure(s) (LRB): RIGHT TOTAL HIP ARTHROPLASTY ANTERIOR APPROACH (Right) Foley cath d/c'ed Advance diet Up with therapy D/C IV fluids Discharge home Follow up in 2 weeks at [facility]. Follow up with OLIN,Kerrion Kemppainen D in 2 weeks.  Contact information:  [facility] [address] [place] Daniele   PAC  05/26/2016, 8:16 AM

## 2016-05-27 NOTE — Discharge Summary (Signed)
Physician Discharge Summary  Patient ID: Diana Martin MRN: [id number] DOB/AGE: Apr 19, 1945 71 y.o.  Admit date: 05/25/2016 Discharge date: 05/26/2016   Procedures:  Procedure(s) (LRB): RIGHT TOTAL HIP ARTHROPLASTY ANTERIOR APPROACH (Right)  Attending Physician:  Dr. Paralee Cancel   Admission Diagnoses:   Right hip primary OA / pain  Discharge Diagnoses:  Principal Problem:   S/P right THA, AA  Past Medical History:  Diagnosis Date  . Arthritis   . Atrophic vaginitis   . Breast cancer ([place])   . Osteopenia   . Vitamin D deficiency     HPI:    Diana Martin, 71 y.o. female, has a history of pain and functional disability in the right hip(s) due to arthritis and patient has failed non-surgical conservative treatments for greater than 12 weeks to include NSAID's and/or analgesics, corticosteriod injections, supervised PT with diminished ADL's post treatment and activity modification.  Onset of symptoms was gradual starting 9 months ago with rapidlly worsening course since that time.The patient noted no past surgery on the right hip(s).  Patient currently rates pain in the right hip at 8 out of 10 with activity. Patient has night pain, worsening of pain with activity and weight bearing, trendelenberg gait, pain that interfers with activities of daily living and pain with passive range of motion. Patient has evidence of periarticular osteophytes and joint space narrowing by imaging studies. This condition presents safety issues increasing the risk of falls. There is no current active infection.   Risks, benefits and expectations were discussed with the patient.  Risks including but not limited to the risk of anesthesia, blood clots, nerve damage, blood vessel damage, failure of the prosthesis, infection and up to and including death.  Patient understand the risks, benefits and expectations and wishes to proceed with surgery.   PCP: Leighton Ruff, MD   Discharged Condition:  good  Hospital Course:  Patient underwent the above stated procedure on 05/25/2016. Patient tolerated the procedure well and brought to the recovery room in good condition and subsequently to the floor.  POD #1 BP: 120/71 ; Pulse: 73 ; Temp: 97.9 F (36.6 C) ; Resp: 15 Patient reports pain as mild, pain controlled. No events throughout the night.  Looking forward to getting better and seeing how she will do after this surgery.  Discussed her lack of ROM with her left hip as well, but it has never given her pain.  Ready to be discharged home. Dorsiflexion/plantar flexion intact, incision: dressing C/D/I, no cellulitis present and compartment soft.   LABS  Basename    HGB     10.6  HCT     30.1    Discharge Exam: General appearance: alert, cooperative and no distress Extremities: Homans sign is negative, no sign of DVT, no edema, redness or tenderness in the calves or thighs and no ulcers, gangrene or trophic changes  Disposition: Home with follow up in 2 weeks   Follow-up Information    Paralee Cancel, MD. Schedule an appointment as soon as possible for a visit in 2 week(s).   Specialty:  Orthopedic Surgery Contact information: [address] [place] [address] [phone]           Discharge Instructions    Call MD / Call 911    Complete by:  As directed    If you experience chest pain or shortness of breath, CALL 911 and be transported to the hospital emergency room.  If you develope a fever above 101  F, pus (white drainage) or increased drainage or redness at the wound, or calf pain, call your surgeon's office.   Change dressing    Complete by:  As directed    Maintain surgical dressing until follow up in the clinic. If the edges start to pull up, may reinforce with tape. If the dressing is no longer working, may remove and cover with gauze and tape, but must keep the area dry and clean.  Call with any questions or concerns.   Constipation Prevention     Complete by:  As directed    Drink plenty of fluids.  Prune juice may be helpful.  You may use a stool softener, such as Colace (over the counter) 100 mg twice a day.  Use MiraLax (over the counter) for constipation as needed.   Diet - low sodium heart healthy    Complete by:  As directed    Discharge instructions    Complete by:  As directed    Maintain surgical dressing until follow up in the clinic. If the edges start to pull up, may reinforce with tape. If the dressing is no longer working, may remove and cover with gauze and tape, but must keep the area dry and clean.  Follow up in 2 weeks at [facility]. Call with any questions or concerns.   Increase activity slowly as tolerated    Complete by:  As directed    Weight bearing as tolerated with assist device (walker, cane, etc) as directed, use it as long as suggested by your surgeon or therapist, typically at least 4-6 weeks.   TED hose    Complete by:  As directed    Use stockings (TED hose) for 2 weeks on both leg(s).  You may remove them at night for sleeping.      Allergies as of 05/26/2016   No Known Allergies     Medication List    TAKE these medications   aspirin 81 MG chewable tablet Chew 1 tablet (81 mg total) by mouth 2 (two) times daily. Take for 4 weeks.   calcium-vitamin D 500-200 MG-UNIT tablet Commonly known as:  OSCAL WITH D Take 1 tablet by mouth daily.   docusate sodium 100 MG capsule Commonly known as:  COLACE Take 1 capsule (100 mg total) by mouth 2 (two) times daily.   ferrous sulfate 325 (65 FE) MG tablet Commonly known as:  FERROUSUL Take 1 tablet (325 mg total) by mouth 3 (three) times daily with meals.   HYDROcodone-acetaminophen 7.5-325 MG tablet Commonly known as:  NORCO Take 1-2 tablets by mouth every 4 (four) hours as needed for moderate pain or severe pain.   methocarbamol 500 MG tablet Commonly known as:  ROBAXIN Take 1 tablet (500 mg total) by mouth every 6 (six) hours as  needed for muscle spasms.   multivitamin tablet Take 1 tablet by mouth daily.   polyethylene glycol packet Commonly known as:  MIRALAX / GLYCOLAX Take 17 g by mouth 2 (two) times daily.        Signed: [place] Pugh. Que Meneely   PA-C  05/27/2016, 10:54 AM

## 2016-06-10 ENCOUNTER — Other Ambulatory Visit: Payer: Self-pay | Admitting: Orthopedic Surgery

## 2016-07-19 ENCOUNTER — Emergency Department (HOSPITAL_COMMUNITY): Payer: Medicare Other

## 2016-07-19 ENCOUNTER — Emergency Department (HOSPITAL_COMMUNITY)
Admission: EM | Admit: 2016-07-19 | Discharge: 2016-07-19 | Disposition: A | Discharge status: Home / Self Care | Payer: Medicare Other | Attending: Emergency Medicine | Admitting: Emergency Medicine

## 2016-07-19 ENCOUNTER — Encounter (HOSPITAL_COMMUNITY): Payer: Self-pay | Admitting: Emergency Medicine

## 2016-07-19 DIAGNOSIS — Z853 Personal history of malignant neoplasm of breast: Secondary | ICD-10-CM | POA: Insufficient documentation

## 2016-07-19 DIAGNOSIS — Y9301 Activity, walking, marching and hiking: Secondary | ICD-10-CM | POA: Diagnosis not present

## 2016-07-19 DIAGNOSIS — W010XXA Fall on same level from slipping, tripping and stumbling without subsequent striking against object, initial encounter: Secondary | ICD-10-CM | POA: Insufficient documentation

## 2016-07-19 DIAGNOSIS — Y92007 Garden or yard of unspecified non-institutional (private) residence as the place of occurrence of the external cause: Secondary | ICD-10-CM | POA: Insufficient documentation

## 2016-07-19 DIAGNOSIS — M79622 Pain in left upper arm: Secondary | ICD-10-CM | POA: Insufficient documentation

## 2016-07-19 DIAGNOSIS — Z96641 Presence of right artificial hip joint: Secondary | ICD-10-CM | POA: Insufficient documentation

## 2016-07-19 DIAGNOSIS — W19XXXA Unspecified fall, initial encounter: Secondary | ICD-10-CM

## 2016-07-19 DIAGNOSIS — M25431 Effusion, right wrist: Secondary | ICD-10-CM | POA: Diagnosis not present

## 2016-07-19 DIAGNOSIS — S6992XA Unspecified injury of left wrist, hand and finger(s), initial encounter: Secondary | ICD-10-CM | POA: Diagnosis present

## 2016-07-19 DIAGNOSIS — Y999 Unspecified external cause status: Secondary | ICD-10-CM | POA: Diagnosis not present

## 2016-07-19 DIAGNOSIS — Z79899 Other long term (current) drug therapy: Secondary | ICD-10-CM | POA: Insufficient documentation

## 2016-07-19 DIAGNOSIS — S52502A Unspecified fracture of the lower end of left radius, initial encounter for closed fracture: Secondary | ICD-10-CM | POA: Diagnosis not present

## 2016-07-19 NOTE — ED Provider Notes (Signed)
[facility] DEPT Provider Note   CSN: [id number] Arrival date & time: 07/19/16  1203     History   Chief Complaint Chief Complaint  Patient presents with  . Fall  . Wrist Pain  . Arm Pain    HPI [name] is a 71 y.o. female with a history of THA on 05/25/16, osteopenia, Who presents today after she fell (mechanical) while walking on uneven ground outside while guarding. She reports that her left wrist immediately began hurting and swelling after her fall.  She does not have any wounds to her wrist. She denies any prodromal symptoms, did not strike her head when she fell. No LOC. No prodromal symptoms. She denies neck pain, chest pain shortness of breath, abdominal pain, hip pain, or leg pain.  HPI  Past Medical History:  Diagnosis Date  . Arthritis   . Atrophic vaginitis   . Breast cancer ([place])   . Osteopenia   . Vitamin D deficiency     Patient Active Problem List   Diagnosis Date Noted  . 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 Osteopenia 03/11/2015  . Postmenopausal atrophic vaginitis 03/11/2015  . SPRAIN AND STRAIN OF RIBS 01/09/2008  . ANKLE INJURY, LEFT 01/09/2008  . BREAST CANCER, HX OF 09/06/2006    Past Surgical History:  Procedure Laterality Date  . BREAST BIOPSY Left 2000  . BREAST CYST ASPIRATION Left 1995  . BREAST LUMPECTOMY Left 07/16/2003  . CATARACT EXTRACTION, BILATERAL  03/2014, 04/2014  . PILONIDAL CYST EXCISION  age 42  . SENTINEL LYMPH NODE BIOPSY Left 07/16/03  . TONSILLECTOMY  as child  . TOTAL HIP ARTHROPLASTY Right 05/25/2016   Procedure: RIGHT TOTAL HIP ARTHROPLASTY ANTERIOR APPROACH;  Surgeon: Paralee Cancel, MD;  Location: [facility];  Service: Orthopedics;  Laterality: Right;  . WISDOM TOOTH EXTRACTION  in college    OB History    Gravida Para Term Preterm AB Living   0 0 0 0 0 0   SAB TAB Ectopic Multiple Live Births   0 0 0 0 0       Home Medications    Prior to Admission medications   Medication Sig Start Date End Date Taking?  Authorizing Provider  calcium-vitamin D (OSCAL WITH D) 500-200 MG-UNIT per tablet Take 1 tablet by mouth daily. 11/03/12   Kem Boroughs, FNP  docusate sodium (COLACE) 100 MG capsule Take 1 capsule (100 mg total) by mouth 2 (two) times daily. 05/25/16   Danae Orleans, PA-C  ferrous sulfate (FERROUSUL) 325 (65 FE) MG tablet Take 1 tablet (325 mg total) by mouth 3 (three) times daily with meals. 05/25/16   Danae Orleans, PA-C  HYDROcodone-acetaminophen (NORCO) 7.5-325 MG tablet Take 1-2 tablets by mouth every 4 (four) hours as needed for moderate pain or severe pain. 05/25/16   Danae Orleans, PA-C  methocarbamol (ROBAXIN) 500 MG tablet Take 1 tablet (500 mg total) by mouth every 6 (six) hours as needed for muscle spasms. 05/25/16   Danae Orleans, PA-C  Multiple Vitamin (MULTIVITAMIN) tablet Take 1 tablet by mouth daily.    [provider]  polyethylene glycol (MIRALAX / GLYCOLAX) packet Take 17 g by mouth 2 (two) times daily. 05/25/16   Danae Orleans, PA-C    Family History Family History  Problem Relation Age of Onset  . Lung cancer Father 89  . Heart disease Mother   . Cervical cancer Paternal Aunt   . Stomach cancer Maternal Grandmother     Social History Social History  Substance Use Topics  . Smoking status: Never Smoker  . Smokeless tobacco: Never Used  . Alcohol use 4.2 oz/week    7 Glasses of wine per week     Allergies   Patient has no known allergies.   Review of Systems Review of Systems  Constitutional: Negative for chills and fever.  HENT: Negative for ear pain and sore throat.   Eyes: Negative for pain and visual disturbance.  Respiratory: Negative for cough and shortness of breath.   Cardiovascular: Negative for chest pain and palpitations.  Gastrointestinal: Negative for abdominal pain, nausea and vomiting.  Genitourinary: Negative for dysuria and hematuria.  Musculoskeletal: Positive for arthralgias and joint swelling. Negative for back pain.    Skin: Negative for color change, pallor, rash and wound.  Neurological: Negative for dizziness, seizures, syncope, numbness and headaches.  All other systems reviewed and are negative.    Physical Exam Updated Vital Signs BP 134/70 (BP Location: Right Arm)   Pulse 90   Temp 98.1 F (36.7 C) (Oral)   Resp 18   LMP 01/12/1996 (Approximate)   SpO2 100%   Physical Exam  Constitutional: She appears well-developed and well-nourished. No distress.  HENT:  Head: Normocephalic and atraumatic.  Eyes: Conjunctivae are normal.  Neck: Normal range of motion. Neck supple.  Cardiovascular: Normal rate, regular rhythm and intact distal pulses.   No murmur heard. Pulmonary/Chest: Effort normal and breath sounds normal. No respiratory distress.  Abdominal: Soft. There is no tenderness.  Musculoskeletal:  Left wrist has obvious swelling, worse on the volar aspect. Patient tolerates full palpation of left wrist with possible deformity palpated. Patient has full range of motion and intact sensation and circulatory function to all fingers. Left forearm and arm compartments are soft, nontender.  Ring was removed from left ring finger during exam.  Neurological: She is alert. No sensory deficit. She exhibits normal muscle tone. Gait normal.  Skin: Skin is warm and dry.  Psychiatric: She has a normal mood and affect.  Nursing note and vitals reviewed.    ED Treatments / Results  Labs (all labs ordered are listed, but only abnormal results are displayed) Labs Reviewed - No data to display  EKG  EKG Interpretation None       Radiology Dg Forearm Left  Result Date: 07/19/2016 CLINICAL DATA:  [place].  Pain. EXAM: LEFT WRIST - COMPLETE 3+ VIEW; LEFT FOREARM - 2 VIEW COMPARISON:  None. FINDINGS: There is a comminuted distal radius fracture with dorsal angulation. Ulnar styloid is intact. The bones of the forearm more proximally are intact as well. There is soft tissue swelling. IMPRESSION:  Comminuted distal radius fracture with dorsal angulation. Ulnar styloid is intact. Electronically Signed   By: Staci Righter M.D.   On: 07/19/2016 13:39   Dg Wrist Complete Left  Result Date: 07/19/2016 CLINICAL DATA:  [place].  Pain. EXAM: LEFT WRIST - COMPLETE 3+ VIEW; LEFT FOREARM - 2 VIEW COMPARISON:  None. FINDINGS: There is a comminuted distal radius fracture with dorsal angulation. Ulnar styloid is intact. The bones of the forearm more proximally are intact as well. There is soft tissue swelling. IMPRESSION: Comminuted distal radius fracture with dorsal angulation. Ulnar styloid is intact. Electronically Signed   By: Staci Righter M.D.   On: 07/19/2016 13:39   Dg Hand Complete Left  Result Date: 07/19/2016 CLINICAL DATA:  71 year old female with a history of fall and wrist pain EXAM: LEFT HAND - COMPLETE 3+ VIEW COMPARISON:  None. FINDINGS: No fractures of  the digits identified. No fracture of the metacarpals. Carpal bones maintain alignment without acute fracture line identified. Mild degenerative changes. Acute fracture of the distal radius with a volar angulation. Changes of osteoarthritis involving the interphalangeal joints IMPRESSION: Acute fracture of the distal radius with volar angulation. No acute fracture identified of the hand or carpal bones. Electronically Signed   By: Corrie Mckusick D.O.   On: 07/19/2016 13:38    Procedures Procedures (including critical care time)  Medications Ordered in ED Medications - No data to display   Initial Impression / Assessment and Plan / ED Course  I have reviewed the triage vital signs and the nursing notes.  Pertinent labs & imaging results that were available during my care of the patient were reviewed by me and considered in my medical decision making (see chart for details).  Clinical Course as of Jul 20 2126  Mon Jul 19, 2016  1403 Informed patient and husband that her wrist is broken and will consult hand.  Patient ring removed and given  to husband.   [EH]  35 Spoke with Dr. Burney Gauze and patient.  Informed patient that plan is to splint with office follow-up with Dr. Burney Gauze tomorrow and, per Dr. Burney Gauze, surgery on Wednesday.  Pain control was discussed with patient, she does not want any medication at this time. She states that she has left over pain medication from when she broke her hip and will take that if needed, declines hydrocodone/oxycodone prescription.  [EH]    Clinical Course User Index [EH] Lorin Glass, PA-C   [name] presents With left wrist pain after experiencing a mechanical fall while outside gardening. She denies striking her head, has no LOC, no headache, neck pain or reported other injuries. X-rays were obtained and reviewed with obvious fracture present.  Skin is intact over fracture site and patient has good circulation, sensation, and motor function.  Left arm all compartments palpated, soft and nontender. Dr. Burney Gauze was consulted who requested that patient be splinted with follow-up in his office tomorrow, and probable surgery on Wednesday.  Patient was offered pain medication while in the ED, however declined. She states that she has pain medication left over from her hip surgery and that she will take that if her pain cannot be controlled with ibuprofen/Tylenol.  Patient was given the option to ask questions, all of which were answered to the best of my ability.   At this time there does not appear to be any evidence of an acute emergency medical condition and the patient appears stable for discharge with appropriate outpatient follow up.Diagnosis was discussed with patient who verbalizes understanding and is agreeable to discharge. Pt case discussed with Dr. Alvino Chapel who agrees with my plan.     Final Clinical Impressions(s) / ED Diagnoses   Final diagnoses:  Fall, initial encounter  Closed fracture of distal end of left radius, unspecified fracture morphology, initial encounter     New Prescriptions Discharge Medication List as of 07/19/2016  2:36 PM       Lorin Glass, PA-C 07/19/16 2128    Davonna Belling, MD 07/20/16 1600

## 2016-07-19 NOTE — ED Triage Notes (Signed)
Patient states that she fell this morning working out in the yard. Patient c/o left wrist and arm pain.

## 2016-07-19 NOTE — Discharge Instructions (Signed)
Please take Ibuprofen (Advil, motrin) and Tylenol (acetaminophen) to relieve your pain.  You may take up to 800 MG (4 pills) of normal strength ibuprofen every 8 hours as needed.  In between doses of ibuprofen you make take tylenol, up to 1,000 mg (two extra strength pills).  Do not take more than 3,000 mg tylenol in a 24 hour period.  Please check all medication labels as many medications such as pain and cold medications may contain tylenol.  Do not drink alcohol while taking these medications.  Do not take other NSAID'S while taking ibuprofen (such as aleve or naproxen).  Please take ibuprofen with food to decrease stomach upset.

## 2016-07-20 DIAGNOSIS — S52502A Unspecified fracture of the lower end of left radius, initial encounter for closed fracture: Secondary | ICD-10-CM | POA: Insufficient documentation

## 2016-11-04 ENCOUNTER — Encounter: Payer: Self-pay | Admitting: Certified Nurse Midwife

## 2017-03-21 ENCOUNTER — Encounter: Payer: Self-pay | Admitting: Certified Nurse Midwife

## 2017-03-22 ENCOUNTER — Ambulatory Visit: Payer: Medicare Other | Admitting: Nurse Practitioner

## 2017-03-22 ENCOUNTER — Encounter: Payer: Self-pay | Admitting: Certified Nurse Midwife

## 2017-03-22 ENCOUNTER — Other Ambulatory Visit: Payer: Self-pay

## 2017-03-22 ENCOUNTER — Ambulatory Visit (INDEPENDENT_AMBULATORY_CARE_PROVIDER_SITE_OTHER): Payer: Medicare Other | Admitting: Certified Nurse Midwife

## 2017-03-22 VITALS — BP 118/64 | HR 92 | Resp 16 | Ht 65.25 in | Wt 120.0 lb

## 2017-03-22 DIAGNOSIS — Z01419 Encounter for gynecological examination (general) (routine) without abnormal findings: Secondary | ICD-10-CM | POA: Diagnosis not present

## 2017-03-22 DIAGNOSIS — N952 Postmenopausal atrophic vaginitis: Secondary | ICD-10-CM | POA: Diagnosis not present

## 2017-03-22 DIAGNOSIS — Z853 Personal history of malignant neoplasm of breast: Secondary | ICD-10-CM | POA: Diagnosis not present

## 2017-03-22 NOTE — Patient Instructions (Signed)

## 2017-03-22 NOTE — Progress Notes (Signed)
72 y.o. G0P0000 Married  Caucasian Fe here for annual exam. Has plans to have other hip replaced, has gone along so well with previous surgery. Denies vaginal bleeding or vaginal dryness, Vagifem working well. Sees PCP Dr. Edwin Dada yearly/labs, recent pre -op visit for hip surgery all normal. Had rapid heart rate with negative echo and cardio evaluation, no problems per patient. No other health concerns today.  Patient's last menstrual period was 01/12/1996 (approximate).          Sexually active: No.  The current method of family planning is post menopausal status.    Exercising: Yes.    walking, weights Smoker:  no  Health Maintenance: Pap:  11/06/13 neg, 03-16-16 neg HPV HR neg History of Abnormal Pap: no MMG:  2018 neg per patient  Self Breast exams: yes Colonoscopy: 2010 BMD:   2017 TDaP:  2011 Shingles: had done Pneumonia: 2017 Hep C and HIV: Hep c neg 2017 Labs: done with pcp   reports that  has never smoked. she has never used smokeless tobacco. She reports that she drinks about 4.2 oz of alcohol per week. She reports that she does not use drugs.  Past Medical History:  Diagnosis Date  . Arthritis   . Atrophic vaginitis   . Breast cancer ([place])   . Broken wrist    left  . Osteopenia   . Vitamin D deficiency     Past Surgical History:  Procedure Laterality Date  . BREAST BIOPSY Left 2000  . BREAST CYST ASPIRATION Left 1995  . BREAST LUMPECTOMY Left 07/16/2003  . CATARACT EXTRACTION, BILATERAL  03/2014, 04/2014  . PILONIDAL CYST EXCISION  age 62  . SENTINEL LYMPH NODE BIOPSY Left 07/16/03  . TONSILLECTOMY  as child  . TOTAL HIP ARTHROPLASTY Right 05/25/2016   Procedure: RIGHT TOTAL HIP ARTHROPLASTY ANTERIOR APPROACH;  Surgeon: Paralee Cancel, MD;  Location: [facility];  Service: Orthopedics;  Laterality: Right;  . WISDOM TOOTH EXTRACTION  in college    Current Outpatient Medications  Medication Sig Dispense Refill  . calcium-vitamin D (OSCAL WITH D) 500-200 MG-UNIT per  tablet Take 1 tablet by mouth daily. 30 tablet 3  . Estradiol (VAGIFEM) 10 MCG TABS vaginal tablet Place vaginally 2 (two) times a week.    . ferrous sulfate (FERROUSUL) 325 (65 FE) MG tablet Take 1 tablet (325 mg total) by mouth 3 (three) times daily with meals.    . Multiple Vitamin (MULTIVITAMIN) tablet Take 1 tablet by mouth daily.     No current facility-administered medications for this visit.     Family History  Problem Relation Age of Onset  . Lung cancer Father 78  . Heart disease Mother   . Cervical cancer Paternal Aunt   . Stomach cancer Maternal Grandmother     ROS:  Pertinent items are noted in HPI.  Otherwise, a comprehensive ROS was negative.  Exam:   BP 118/64   Pulse 92   Resp 16   Ht 5' 5.25" (1.657 m)   Wt 120 lb (54.4 kg)   LMP 01/12/1996 (Approximate)   BMI 19.82 kg/m  Height: 5' 5.25" (165.7 cm) Ht Readings from Last 3 Encounters:  03/22/17 5' 5.25" (1.657 m)  05/25/16 5' 5.5" (1.664 m)  05/20/16 5' 5.5" (1.664 m)    General appearance: alert, cooperative and appears stated age Head: Normocephalic, without obvious abnormality, atraumatic Neck: no adenopathy, supple, symmetrical, trachea midline and thyroid normal to inspection and palpation Lungs: clear to auscultation bilaterally Breasts: normal  appearance, no masses or tenderness, No nipple retraction or dimpling, No nipple discharge or bleeding, No axillary or supraclavicular adenopathy, surgical changes on left from breast cancer surgery Heart: regular rate and rhythm Abdomen: soft, non-tender; no masses,  no organomegaly Extremities: extremities normal, atraumatic, no cyanosis or edema Skin: Skin color, texture, turgor normal. No rashes or lesions Lymph nodes: Cervical, supraclavicular, and axillary nodes normal. No abnormal inguinal nodes palpated Neurologic: Grossly normal   Pelvic: External genitalia:  no lesions              Urethra:  normal appearing urethra with no masses, tenderness or  lesions              Bartholin's and Skene's: normal                 Vagina: normal appearing vagina with normal color and discharge, no lesions              Cervix: no cervical motion tenderness, no lesions and normal appearance              Pap taken: No. Bimanual Exam:  Uterus:  normal size, contour, position, consistency, mobility, non-tender              Adnexa: normal adnexa and no mass, fullness, tenderness               Rectovaginal: Confirms               Anus:  normal sphincter tone, no lesions  Chaperone present: yes  A:  Well Woman with normal exam  Menopausal no HRT  Atrophic vaginitis with Vagifem use  History of Breast cancer >10 years  Hip replacement scheduled  BMD due will schedule P:   Reviewed health and wellness pertinent to exam  Aware of need to advise if vaginal bleeding  Discussed risks and benefits of Vagifem and warning signs. Patient desires continuance.  Rx Vagifem see order with instructions will refill once mammogram report in from 2018  Stressed Mammogram and SBE  Continue follow up with MD as indicated  Patient will schedule BMD with mammogram.  Pap smear: no   counseled on breast self exam, mammography screening, feminine hygiene, osteoporosis, adequate intake of calcium and vitamin D, diet and exercise  return annually or prn  An After Visit Summary was printed and given to the patient.

## 2017-03-23 MED ORDER — ESTRADIOL 10 MCG VA TABS: 1.0000 | ORAL_TABLET | VAGINAL | 12 refills | Status: DC

## 2017-03-23 NOTE — Addendum Note (Signed)
Addended by: Regina Eck on: 03/23/2017 08:22 AM   Modules accepted: Orders

## 2017-03-31 ENCOUNTER — Other Ambulatory Visit: Payer: Self-pay | Admitting: Family Medicine

## 2017-03-31 DIAGNOSIS — M1612 Unilateral primary osteoarthritis, left hip: Secondary | ICD-10-CM | POA: Insufficient documentation

## 2017-03-31 DIAGNOSIS — M25552 Pain in left hip: Secondary | ICD-10-CM | POA: Insufficient documentation

## 2017-03-31 DIAGNOSIS — R0989 Other specified symptoms and signs involving the circulatory and respiratory systems: Secondary | ICD-10-CM

## 2017-04-04 ENCOUNTER — Ambulatory Visit
Admission: RE | Admit: 2017-04-04 | Discharge: 2017-04-04 | Disposition: A | Discharge status: Home / Self Care | Payer: Medicare Other | Source: Ambulatory Visit | Attending: Family Medicine | Admitting: Family Medicine

## 2017-04-04 DIAGNOSIS — R0989 Other specified symptoms and signs involving the circulatory and respiratory systems: Secondary | ICD-10-CM

## 2017-04-04 NOTE — H&P (Signed)
TOTAL HIP ADMISSION H&P  Patient is admitted for left total hip arthroplasty, anterior approach.  Subjective:  Chief Complaint:  Left hip primary OA / pain  HPI: Diana Martin, 72 y.o. female, has a history of pain and functional disability in the left hip(s) due to arthritis and patient has failed non-surgical conservative treatments for greater than 12 weeks to include NSAID's and/or analgesics and activity modification.  Onset of symptoms was gradual starting <1 year ago with rapidlly worsening course since that time.The patient noted prior procedures of the hip to include arthroplasty on the right hip in 2018.  Patient currently rates pain in the left hip at 8 out of 10 with activity. Patient has worsening of pain with activity and weight bearing, trendelenberg gait, pain that interfers with activities of daily living and pain with passive range of motion. Patient has evidence of periarticular osteophytes and joint space narrowing by imaging studies. This condition presents safety issues increasing the risk of falls.   There is no current active infection.   Risks, benefits and expectations were discussed with the patient.  Risks including but not limited to the risk of anesthesia, blood clots, nerve damage, blood vessel damage, failure of the prosthesis, infection and up to and including death.  Patient understand the risks, benefits and expectations and wishes to proceed with surgery.   PCP: Leighton Ruff, MD  D/C Plans:       Home   Post-op Meds:       No Rx given  Tranexamic Acid:      To be given - IV   Decadron:      Is to be given  FYI:      ASA  Norco  DME:   Pt already has equipment  PT:   No PT    Patient Active Problem List   Diagnosis Date Noted  . Closed fracture of left distal radius 07/20/2016  . 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 Osteopenia 03/11/2015  . Postmenopausal atrophic vaginitis 03/11/2015  . SPRAIN AND STRAIN OF RIBS 01/09/2008  . ANKLE INJURY,  LEFT 01/09/2008  . BREAST CANCER, HX OF 09/06/2006   Past Medical History:  Diagnosis Date  . Arthritis   . Atrophic vaginitis   . Breast cancer ([place])   . Broken wrist    left  . Osteopenia   . Vitamin D deficiency     Past Surgical History:  Procedure Laterality Date  . BREAST BIOPSY Left 2000  . BREAST CYST ASPIRATION Left 1995  . BREAST LUMPECTOMY Left 07/16/2003  . CATARACT EXTRACTION, BILATERAL  03/2014, 04/2014  . PILONIDAL CYST EXCISION  age 64  . SENTINEL LYMPH NODE BIOPSY Left 07/16/03  . TONSILLECTOMY  as child  . TOTAL HIP ARTHROPLASTY Right 05/25/2016   Procedure: RIGHT TOTAL HIP ARTHROPLASTY ANTERIOR APPROACH;  Surgeon: Paralee Cancel, MD;  Location: [facility];  Service: Orthopedics;  Laterality: Right;  . WISDOM TOOTH EXTRACTION  in college    No current facility-administered medications for this encounter.    Current Outpatient Medications  Medication Sig Dispense Refill Last Dose  . calcium-vitamin D (OSCAL WITH D) 500-200 MG-UNIT per tablet Take 1 tablet by mouth daily. 30 tablet 3 Taking  . Estradiol (VAGIFEM) 10 MCG TABS vaginal tablet Place 1 tablet (10 mcg total) vaginally 2 (two) times a week. 8 tablet 12   . Multiple Vitamin (MULTIVITAMIN) tablet Take 1 tablet by mouth daily.   Taking   No Known Allergies   Social  History   Tobacco Use  . Smoking status: Never Smoker  . Smokeless tobacco: Never Used  Substance Use Topics  . Alcohol use: Yes    Alcohol/week: 4.2 oz    Types: 7 Glasses of wine per week    Family History  Problem Relation Age of Onset  . Lung cancer Father 33  . Heart disease Mother   . Cervical cancer Paternal Aunt   . Stomach cancer Maternal Grandmother      Review of Systems  Constitutional: Negative.   HENT: Negative.   Eyes: Negative.   Respiratory: Negative.   Cardiovascular: Negative.   Gastrointestinal: Negative.   Genitourinary: Negative.   Musculoskeletal: Positive for joint pain.  Skin: Negative.   Neurological:  Negative.   Endo/Heme/Allergies: Negative.   Psychiatric/Behavioral: Negative.     Objective:  Physical Exam  Constitutional: She is oriented to person, place, and time. She appears well-developed.  HENT:  Head: Normocephalic.  Eyes: Pupils are equal, round, and reactive to light.  Neck: Neck supple. No JVD present. No tracheal deviation present. No thyromegaly present.  Cardiovascular: Normal rate, regular rhythm and intact distal pulses.  Respiratory: Effort normal and breath sounds normal. No respiratory distress. She has no wheezes.  GI: Soft. There is no tenderness. There is no guarding.  Musculoskeletal:       Left hip: She exhibits decreased range of motion, decreased strength, tenderness and bony tenderness. She exhibits no swelling, no deformity and no laceration.  Lymphadenopathy:    She has no cervical adenopathy.  Neurological: She is alert and oriented to person, place, and time.  Skin: Skin is warm and dry.  Psychiatric: She has a normal mood and affect.     Labs:  Estimated body mass index is 19.82 kg/m as calculated from the following:   Height as of 03/22/17: 5' 5.25" (1.657 m).   Weight as of 03/22/17: 54.4 kg (120 lb).   Imaging Review Plain radiographs demonstrate severe degenerative joint disease of the left hip(s). The bone quality appears to be good for age and reported activity level.  Assessment/Plan:  End stage arthritis, left hip(s)  The patient history, physical examination, clinical judgement of the provider and imaging studies are consistent with end stage degenerative joint disease of the left hip(s) and total hip arthroplasty is deemed medically necessary. The treatment options including medical management, injection therapy, arthroscopy and arthroplasty were discussed at length. The risks and benefits of total hip arthroplasty were presented and reviewed. The risks due to aseptic loosening, infection, stiffness, dislocation/subluxation,   thromboembolic complications and other imponderables were discussed.  The patient acknowledged the explanation, agreed to proceed with the plan and consent was signed. Patient is being admitted for inpatient treatment for surgery, pain control, PT, OT, prophylactic antibiotics, VTE prophylaxis, progressive ambulation and ADL's and discharge planning.The patient is planning to be discharged home.     [place] Pugh Shambria Camerer   PA-C  04/04/2017, 4:14 PM

## 2017-04-05 NOTE — Patient Instructions (Signed)
Diana Martin  04/05/2017   Your procedure is scheduled on: 04-12-17   Report to [facility] Main  Entrance Report to Admitting at 6:10 AM    Call this number if you have problems the morning of surgery [phone]   Remember: Do not eat food or drink liquids :After Midnight.     Take these medicines the morning of surgery with A SIP OF WATER: None                                You may not have any metal on your body including hair pins and              piercings  Do not wear jewelry, make-up, lotions, powders or perfumes, deodorant             Do not wear nail polish.  Do not shave  48 hours prior to surgery.                Do not bring valuables to the hospital. [place].  Contacts, dentures or bridgework may not be worn into surgery.  Leave suitcase in the car. After surgery it may be brought to your room.                Please read over the following fact sheets you were given: _____________________________________________________________________          [facility] - Preparing for Surgery Before surgery, you can play an important role.  Because skin is not sterile, your skin needs to be as free of germs as possible.  You can reduce the number of germs on your skin by washing with CHG (chlorahexidine gluconate) soap before surgery.  CHG is an antiseptic cleaner which kills germs and bonds with the skin to continue killing germs even after washing. Please DO NOT use if you have an allergy to CHG or antibacterial soaps.  If your skin becomes reddened/irritated stop using the CHG and inform your nurse when you arrive at Short Stay. Do not shave (including legs and underarms) for at least 48 hours prior to the first CHG shower.  You may shave your face/neck. Please follow these instructions carefully:  1.  Shower with CHG Soap the night before surgery and the  morning of Surgery.  2.  If you choose to wash  your hair, wash your hair first as usual with your  normal  shampoo.  3.  After you shampoo, rinse your hair and body thoroughly to remove the  shampoo.                           4.  Use CHG as you would any other liquid soap.  You can apply chg directly  to the skin and wash                       Gently with a scrungie or clean washcloth.  5.  Apply the CHG Soap to your body ONLY FROM THE NECK DOWN.   Do not use on face/ open  Wound or open sores. Avoid contact with eyes, ears mouth and genitals (private parts).                       Wash face,  Genitals (private parts) with your normal soap.             6.  Wash thoroughly, paying special attention to the area where your surgery  will be performed.  7.  Thoroughly rinse your body with warm water from the neck down.  8.  DO NOT shower/wash with your normal soap after using and rinsing off  the CHG Soap.                9.  Pat yourself dry with a clean towel.            10.  Wear clean pajamas.            11.  Place clean sheets on your bed the night of your first shower and do not  sleep with pets. Day of Surgery : Do not apply any lotions/deodorants the morning of surgery.  Please wear clean clothes to the hospital/surgery center.  FAILURE TO FOLLOW THESE INSTRUCTIONS MAY RESULT IN THE CANCELLATION OF YOUR SURGERY PATIENT SIGNATURE_________________________________  NURSE SIGNATURE__________________________________  ________________________________________________________________________   Adam Phenix  An incentive spirometer is a tool that can help keep your lungs clear and active. This tool measures how well you are filling your lungs with each breath. Taking long deep breaths may help reverse or decrease the chance of developing breathing (pulmonary) problems (especially infection) following:  A long period of time when you are unable to move or be active. BEFORE THE PROCEDURE   If the spirometer  includes an indicator to show your best effort, your nurse or respiratory therapist will set it to a desired goal.  If possible, sit up straight or lean slightly forward. Try not to slouch.  Hold the incentive spirometer in an upright position. INSTRUCTIONS FOR USE  1. Sit on the edge of your bed if possible, or sit up as far as you can in bed or on a chair. 2. Hold the incentive spirometer in an upright position. 3. Breathe out normally. 4. Place the mouthpiece in your mouth and seal your lips tightly around it. 5. Breathe in slowly and as deeply as possible, raising the piston or the ball toward the top of the column. 6. Hold your breath for 3-5 seconds or for as long as possible. Allow the piston or ball to fall to the bottom of the column. 7. Remove the mouthpiece from your mouth and breathe out normally. 8. Rest for a few seconds and repeat Steps 1 through 7 at least 10 times every 1-2 hours when you are awake. Take your time and take a few normal breaths between deep breaths. 9. The spirometer may include an indicator to show your best effort. Use the indicator as a goal to work toward during each repetition. 10. After each set of 10 deep breaths, practice coughing to be sure your lungs are clear. If you have an incision (the cut made at the time of surgery), support your incision when coughing by placing a pillow or rolled up towels firmly against it. Once you are able to get out of bed, walk around indoors and cough well. You may stop using the incentive spirometer when instructed by your caregiver.  RISKS AND COMPLICATIONS  Take your time so you do not get  dizzy or light-headed.  If you are in pain, you may need to take or ask for pain medication before doing incentive spirometry. It is harder to take a deep breath if you are having pain. AFTER USE  Rest and breathe slowly and easily.  It can be helpful to keep track of a log of your progress. Your caregiver can provide you with a  simple table to help with this. If you are using the spirometer at home, follow these instructions: [place] IF:   You are having difficultly using the spirometer.  You have trouble using the spirometer as often as instructed.  Your pain medication is not giving enough relief while using the spirometer.  You develop fever of 100.5 F (38.1 C) or higher. SEEK IMMEDIATE MEDICAL CARE IF:   You cough up bloody sputum that had not been present before.  You develop fever of 102 F (38.9 C) or greater.  You develop worsening pain at or near the incision site. MAKE SURE YOU:   Understand these instructions.  Will watch your condition.  Will get help right away if you are not doing well or get worse. Document Released: 05/10/2006 Document Revised: 03/22/2011 Document Reviewed: 07/11/2006 ExitCare Patient Information 2014 Exit[employer], [place].   ________________________________________________________________________  WHAT IS A BLOOD TRANSFUSION? Blood Transfusion Information  A transfusion is the replacement of blood or some of its parts. Blood is made up of multiple cells which provide different functions.  Red blood cells carry oxygen and are used for blood loss replacement.  White blood cells fight against infection.  Platelets control bleeding.  Plasma helps clot blood.  Other blood products are available for specialized needs, such as hemophilia or other clotting disorders. BEFORE THE TRANSFUSION  Who gives blood for transfusions?   Healthy volunteers who are fully evaluated to make sure their blood is safe. This is blood bank blood. Transfusion therapy is the safest it has ever been in the practice of medicine. Before blood is taken from a donor, a complete history is taken to make sure that person has no history of diseases nor engages in risky social behavior (examples are intravenous drug use or sexual activity with multiple partners). The donor's travel history  is screened to minimize risk of transmitting infections, such as malaria. The donated blood is tested for signs of infectious diseases, such as HIV and hepatitis. The blood is then tested to be sure it is compatible with you in order to minimize the chance of a transfusion reaction. If you or a relative donates blood, this is often done in anticipation of surgery and is not appropriate for emergency situations. It takes many days to process the donated blood. RISKS AND COMPLICATIONS Although transfusion therapy is very safe and saves many lives, the main dangers of transfusion include:   Getting an infectious disease.  Developing a transfusion reaction. This is an allergic reaction to something in the blood you were given. Every precaution is taken to prevent this. The decision to have a blood transfusion has been considered carefully by your caregiver before blood is given. Blood is not given unless the benefits outweigh the risks. AFTER THE TRANSFUSION  Right after receiving a blood transfusion, you will usually feel much better and more energetic. This is especially true if your red blood cells have gotten low (anemic). The transfusion raises the level of the red blood cells which carry oxygen, and this usually causes an energy increase.  The nurse administering the transfusion will  monitor you carefully for complications. HOME CARE INSTRUCTIONS  No special instructions are needed after a transfusion. You may find your energy is better. Speak with your caregiver about any limitations on activity for underlying diseases you may have. SEEK MEDICAL CARE IF:   Your condition is not improving after your transfusion.  You develop redness or irritation at the intravenous (IV) site. SEEK IMMEDIATE MEDICAL CARE IF:  Any of the following symptoms occur over the next 12 hours:  Shaking chills.  You have a temperature by mouth above 102 F (38.9 C), not controlled by medicine.  Chest, back, or  muscle pain.  People around you feel you are not acting correctly or are confused.  Shortness of breath or difficulty breathing.  Dizziness and fainting.  You get a rash or develop hives.  You have a decrease in urine output.  Your urine turns a dark color or changes to pink, red, or brown. Any of the following symptoms occur over the next 10 days:  You have a temperature by mouth above 102 F (38.9 C), not controlled by medicine.  Shortness of breath.  Weakness after normal activity.  The white part of the eye turns yellow (jaundice).  You have a decrease in the amount of urine or are urinating less often.  Your urine turns a dark color or changes to pink, red, or brown. Document Released: 12/26/1999 Document Revised: 03/22/2011 Document Reviewed: 08/14/2007 [facility] Patient Information 2014 [place], [place].  _______________________________________________________________________

## 2017-04-05 NOTE — Progress Notes (Addendum)
05-11-16 (Epic) EKG  04-04-17 Cardiac Clearance from Dr. Wynonia Lawman on chart  03-28-17 [facility]) Surgical Clearance from Dr. Drema Dallas

## 2017-04-07 ENCOUNTER — Encounter (HOSPITAL_COMMUNITY): Payer: Self-pay

## 2017-04-07 ENCOUNTER — Other Ambulatory Visit: Payer: Self-pay

## 2017-04-07 ENCOUNTER — Encounter (HOSPITAL_COMMUNITY)
Admission: RE | Admit: 2017-04-07 | Discharge: 2017-04-07 | Disposition: A | Discharge status: Home / Self Care | Payer: Medicare Other | Source: Ambulatory Visit | Attending: Orthopedic Surgery | Admitting: Orthopedic Surgery

## 2017-04-07 DIAGNOSIS — M1612 Unilateral primary osteoarthritis, left hip: Secondary | ICD-10-CM | POA: Diagnosis not present

## 2017-04-07 DIAGNOSIS — Z01812 Encounter for preprocedural laboratory examination: Secondary | ICD-10-CM | POA: Diagnosis present

## 2017-04-07 DIAGNOSIS — M25552 Pain in left hip: Secondary | ICD-10-CM | POA: Insufficient documentation

## 2017-04-07 LAB — BASIC METABOLIC PANEL
Anion gap: 8 (ref 5–15)
BUN: 17 mg/dL (ref 6–20)
CO2: 26 mmol/L (ref 22–32)
CREATININE: 0.68 mg/dL (ref 0.44–1.00)
Calcium: 9.6 mg/dL (ref 8.9–10.3)
Chloride: 107 mmol/L (ref 101–111)
GFR calc non Af Amer: >60 mL/min (ref >60)
Glucose, Bld: 105 mg/dL — ABNORMAL HIGH (ref 65–99)
Potassium: 4.3 mmol/L (ref 3.5–5.1)
Sodium: 141 mmol/L (ref 135–145)

## 2017-04-07 LAB — SURGICAL PCR SCREEN
MRSA, PCR: NEGATIVE
Staphylococcus aureus: NEGATIVE

## 2017-04-07 LAB — CBC
HCT: 38.4 % (ref 36.0–46.0)
Hemoglobin: 12.8 g/dL (ref 12.0–15.0)
MCH: 31.5 pg (ref 26.0–34.0)
MCHC: 33.3 g/dL (ref 30.0–36.0)
MCV: 94.6 fL (ref 78.0–100.0)
PLATELETS: 209 10*3/uL (ref 150–400)
RBC: 4.06 MIL/uL (ref 3.87–5.11)
RDW: 13.1 % (ref 11.5–15.5)
WBC: 5.4 10*3/uL (ref 4.0–10.5)

## 2017-04-11 MED ORDER — SODIUM CHLORIDE 0.9 % IV SOLN
1000.0000 mg | INTRAVENOUS | Status: AC
  Administered 2017-04-12: 1000 mg via INTRAVENOUS
  Filled 2017-04-11: qty 1100

## 2017-04-12 ENCOUNTER — Inpatient Hospital Stay (HOSPITAL_COMMUNITY): Payer: Medicare Other

## 2017-04-12 ENCOUNTER — Other Ambulatory Visit: Payer: Self-pay

## 2017-04-12 ENCOUNTER — Encounter (HOSPITAL_COMMUNITY)
Admission: RE | Disposition: A | Discharge status: Home / Self Care | Payer: Self-pay | Source: Ambulatory Visit | Attending: Orthopedic Surgery

## 2017-04-12 ENCOUNTER — Inpatient Hospital Stay (HOSPITAL_COMMUNITY): Payer: Medicare Other | Admitting: Anesthesiology

## 2017-04-12 ENCOUNTER — Inpatient Hospital Stay (HOSPITAL_COMMUNITY)
Admission: RE | Admit: 2017-04-12 | Discharge: 2017-04-13 | DRG: 470 | Disposition: A | Discharge status: Home / Self Care | Payer: Medicare Other | Source: Ambulatory Visit | Attending: Orthopedic Surgery | Admitting: Orthopedic Surgery

## 2017-04-12 ENCOUNTER — Encounter (HOSPITAL_COMMUNITY): Payer: Self-pay | Admitting: *Deleted

## 2017-04-12 DIAGNOSIS — Z853 Personal history of malignant neoplasm of breast: Secondary | ICD-10-CM | POA: Diagnosis not present

## 2017-04-12 DIAGNOSIS — Z96642 Presence of left artificial hip joint: Secondary | ICD-10-CM

## 2017-04-12 DIAGNOSIS — M8580 Other specified disorders of bone density and structure, unspecified site: Secondary | ICD-10-CM | POA: Diagnosis present

## 2017-04-12 DIAGNOSIS — Z96641 Presence of right artificial hip joint: Secondary | ICD-10-CM | POA: Diagnosis present

## 2017-04-12 DIAGNOSIS — Z96649 Presence of unspecified artificial hip joint: Secondary | ICD-10-CM

## 2017-04-12 DIAGNOSIS — Z9181 History of falling: Secondary | ICD-10-CM | POA: Diagnosis not present

## 2017-04-12 DIAGNOSIS — M1612 Unilateral primary osteoarthritis, left hip: Secondary | ICD-10-CM | POA: Diagnosis present

## 2017-04-12 HISTORY — PX: TOTAL HIP ARTHROPLASTY: SHX124

## 2017-04-12 LAB — TYPE AND SCREEN
ABO/RH(D): A POS
ANTIBODY SCREEN: NEGATIVE

## 2017-04-12 SURGERY — ARTHROPLASTY, HIP, TOTAL, ANTERIOR APPROACH
Anesthesia: Spinal | Site: Hip | Laterality: Left

## 2017-04-12 MED ORDER — EPHEDRINE SULFATE 50 MG/ML IJ SOLN
10.0000 mg | Freq: Once | INTRAMUSCULAR | Status: AC
  Administered 2017-04-12: 10 mg via INTRAVENOUS

## 2017-04-12 MED ORDER — SODIUM CHLORIDE 0.9 % IV SOLN
INTRAVENOUS | Status: DC
  Administered 2017-04-12: 14:00:00 via INTRAVENOUS

## 2017-04-12 MED ORDER — PHENYLEPHRINE HCL 10 MG/ML IJ SOLN
INTRAMUSCULAR | Status: AC
  Filled 2017-04-12: qty 1

## 2017-04-12 MED ORDER — DEXAMETHASONE SODIUM PHOSPHATE 10 MG/ML IJ SOLN
10.0000 mg | Freq: Once | INTRAMUSCULAR | Status: AC
  Administered 2017-04-12: 10 mg via INTRAVENOUS

## 2017-04-12 MED ORDER — FERROUS SULFATE 325 (65 FE) MG PO TABS: 325.0000 mg | ORAL_TABLET | Freq: Three times a day (TID) | ORAL | 3 refills | Status: DC

## 2017-04-12 MED ORDER — ONDANSETRON HCL 4 MG/2ML IJ SOLN: 4.0000 mg | Freq: Four times a day (QID) | INTRAMUSCULAR | Status: DC | PRN

## 2017-04-12 MED ORDER — MIDAZOLAM HCL 2 MG/2ML IJ SOLN
INTRAMUSCULAR | Status: AC
  Filled 2017-04-12: qty 2

## 2017-04-12 MED ORDER — 0.9 % SODIUM CHLORIDE (POUR BTL) OPTIME
TOPICAL | Status: DC | PRN
  Administered 2017-04-12: 1000 mL

## 2017-04-12 MED ORDER — CELECOXIB 200 MG PO CAPS
200.0000 mg | ORAL_CAPSULE | Freq: Two times a day (BID) | ORAL | Status: DC
  Administered 2017-04-12 – 2017-04-13 (×2): 200 mg via ORAL
  Filled 2017-04-12 (×2): qty 1

## 2017-04-12 MED ORDER — KETOROLAC TROMETHAMINE 30 MG/ML IJ SOLN
30.0000 mg | Freq: Once | INTRAMUSCULAR | Status: AC | PRN
  Administered 2017-04-12: 30 mg via INTRAVENOUS

## 2017-04-12 MED ORDER — METHOCARBAMOL 500 MG PO TABS: 500.0000 mg | ORAL_TABLET | Freq: Four times a day (QID) | ORAL | 0 refills | Status: DC | PRN

## 2017-04-12 MED ORDER — MENTHOL 3 MG MT LOZG: 1.0000 | LOZENGE | OROMUCOSAL | Status: DC | PRN

## 2017-04-12 MED ORDER — PROPOFOL 10 MG/ML IV BOLUS
INTRAVENOUS | Status: AC
  Filled 2017-04-12: qty 40

## 2017-04-12 MED ORDER — MORPHINE SULFATE (PF) 2 MG/ML IV SOLN: 0.5000 mg | INTRAVENOUS | Status: DC | PRN

## 2017-04-12 MED ORDER — CHLORHEXIDINE GLUCONATE 4 % EX LIQD: 60.0000 mL | Freq: Once | CUTANEOUS | Status: DC

## 2017-04-12 MED ORDER — DIPHENHYDRAMINE HCL 12.5 MG/5ML PO ELIX: 12.5000 mg | ORAL_SOLUTION | ORAL | Status: DC | PRN

## 2017-04-12 MED ORDER — ALUM & MAG HYDROXIDE-SIMETH 200-200-20 MG/5ML PO SUSP: 15.0000 mL | ORAL | Status: DC | PRN

## 2017-04-12 MED ORDER — DEXAMETHASONE SODIUM PHOSPHATE 10 MG/ML IJ SOLN
10.0000 mg | Freq: Once | INTRAMUSCULAR | Status: AC
  Administered 2017-04-13: 08:00:00 10 mg via INTRAVENOUS
  Filled 2017-04-12: qty 1

## 2017-04-12 MED ORDER — STERILE WATER FOR IRRIGATION IR SOLN
Status: DC | PRN
  Administered 2017-04-12: 2000 mL

## 2017-04-12 MED ORDER — DOCUSATE SODIUM 100 MG PO CAPS
100.0000 mg | ORAL_CAPSULE | Freq: Two times a day (BID) | ORAL | Status: DC
  Administered 2017-04-12 – 2017-04-13 (×2): 100 mg via ORAL
  Filled 2017-04-12 (×2): qty 1

## 2017-04-12 MED ORDER — HYDROCODONE-ACETAMINOPHEN 5-325 MG PO TABS
1.0000 | ORAL_TABLET | ORAL | Status: DC | PRN
  Administered 2017-04-12 (×3): 1 via ORAL
  Filled 2017-04-12: qty 1
  Filled 2017-04-12: qty 2
  Filled 2017-04-12: qty 1

## 2017-04-12 MED ORDER — ONDANSETRON HCL 4 MG/2ML IJ SOLN
INTRAMUSCULAR | Status: AC
  Filled 2017-04-12: qty 2

## 2017-04-12 MED ORDER — PROPOFOL 500 MG/50ML IV EMUL
INTRAVENOUS | Status: DC | PRN
  Administered 2017-04-12: 10 mg via INTRAVENOUS
  Administered 2017-04-12: 30 mg via INTRAVENOUS
  Administered 2017-04-12: 10 mg via INTRAVENOUS

## 2017-04-12 MED ORDER — POLYETHYLENE GLYCOL 3350 17 G PO PACK
17.0000 g | PACK | Freq: Two times a day (BID) | ORAL | Status: DC
  Administered 2017-04-12: 20:00:00 17 g via ORAL
  Filled 2017-04-12 (×2): qty 1

## 2017-04-12 MED ORDER — MIDAZOLAM HCL 5 MG/5ML IJ SOLN
INTRAMUSCULAR | Status: DC | PRN
  Administered 2017-04-12 (×2): 1 mg via INTRAVENOUS

## 2017-04-12 MED ORDER — HYDROCODONE-ACETAMINOPHEN 7.5-325 MG PO TABS: 1.0000 | ORAL_TABLET | ORAL | Status: DC | PRN

## 2017-04-12 MED ORDER — METHOCARBAMOL 1000 MG/10ML IJ SOLN
500.0000 mg | Freq: Four times a day (QID) | INTRAVENOUS | Status: DC | PRN
  Administered 2017-04-12: 500 mg via INTRAVENOUS
  Filled 2017-04-12: qty 550

## 2017-04-12 MED ORDER — PROPOFOL 10 MG/ML IV BOLUS
INTRAVENOUS | Status: AC
  Filled 2017-04-12: qty 20

## 2017-04-12 MED ORDER — EPHEDRINE SULFATE 50 MG/ML IJ SOLN
INTRAMUSCULAR | Status: AC
  Filled 2017-04-12: qty 1

## 2017-04-12 MED ORDER — PROPOFOL 500 MG/50ML IV EMUL
INTRAVENOUS | Status: DC | PRN
  Administered 2017-04-12: 75 ug/kg/min via INTRAVENOUS

## 2017-04-12 MED ORDER — POLYETHYLENE GLYCOL 3350 17 G PO PACK: 17.0000 g | PACK | Freq: Two times a day (BID) | ORAL | 0 refills | Status: DC

## 2017-04-12 MED ORDER — ASPIRIN 81 MG PO CHEW
81.0000 mg | CHEWABLE_TABLET | Freq: Two times a day (BID) | ORAL | Status: DC
  Administered 2017-04-12 – 2017-04-13 (×2): 81 mg via ORAL
  Filled 2017-04-12 (×2): qty 1

## 2017-04-12 MED ORDER — TRANEXAMIC ACID 1000 MG/10ML IV SOLN
1000.0000 mg | Freq: Once | INTRAVENOUS | Status: AC
  Administered 2017-04-12: 1000 mg via INTRAVENOUS
  Filled 2017-04-12: qty 10

## 2017-04-12 MED ORDER — HYDROCODONE-ACETAMINOPHEN 7.5-325 MG PO TABS: 1.0000 | ORAL_TABLET | ORAL | 0 refills | Status: DC | PRN

## 2017-04-12 MED ORDER — KETOROLAC TROMETHAMINE 30 MG/ML IJ SOLN
INTRAMUSCULAR | Status: AC
  Filled 2017-04-12: qty 1

## 2017-04-12 MED ORDER — METHOCARBAMOL 500 MG PO TABS
500.0000 mg | ORAL_TABLET | Freq: Four times a day (QID) | ORAL | Status: DC | PRN
  Administered 2017-04-12: 500 mg via ORAL
  Filled 2017-04-12: qty 1

## 2017-04-12 MED ORDER — DOCUSATE SODIUM 100 MG PO CAPS: 100.0000 mg | ORAL_CAPSULE | Freq: Two times a day (BID) | ORAL | 0 refills | Status: DC

## 2017-04-12 MED ORDER — PHENYLEPHRINE HCL 10 MG/ML IJ SOLN
INTRAVENOUS | Status: DC | PRN
  Administered 2017-04-12: 25 ug/min via INTRAVENOUS

## 2017-04-12 MED ORDER — MAGNESIUM CITRATE PO SOLN: 1.0000 | Freq: Once | ORAL | Status: DC | PRN

## 2017-04-12 MED ORDER — HYDROMORPHONE HCL 1 MG/ML IJ SOLN
INTRAMUSCULAR | Status: AC
  Filled 2017-04-12: qty 2

## 2017-04-12 MED ORDER — BUPIVACAINE IN DEXTROSE 0.75-8.25 % IT SOLN
INTRATHECAL | Status: DC | PRN
  Administered 2017-04-12: 2 mL via INTRATHECAL

## 2017-04-12 MED ORDER — PROMETHAZINE HCL 25 MG/ML IJ SOLN: 6.2500 mg | INTRAMUSCULAR | Status: DC | PRN

## 2017-04-12 MED ORDER — PHENOL 1.4 % MT LIQD: 1.0000 | OROMUCOSAL | Status: DC | PRN

## 2017-04-12 MED ORDER — MEPERIDINE HCL 50 MG/ML IJ SOLN: 6.2500 mg | INTRAMUSCULAR | Status: DC | PRN

## 2017-04-12 MED ORDER — CEFAZOLIN SODIUM-DEXTROSE 2-4 GM/100ML-% IV SOLN
2.0000 g | INTRAVENOUS | Status: AC
  Administered 2017-04-12: 2 g via INTRAVENOUS
  Filled 2017-04-12: qty 100

## 2017-04-12 MED ORDER — METOCLOPRAMIDE HCL 5 MG PO TABS: 5.0000 mg | ORAL_TABLET | Freq: Three times a day (TID) | ORAL | Status: DC | PRN

## 2017-04-12 MED ORDER — ACETAMINOPHEN 325 MG PO TABS
325.0000 mg | ORAL_TABLET | Freq: Four times a day (QID) | ORAL | Status: DC | PRN
  Administered 2017-04-13: 650 mg via ORAL
  Filled 2017-04-12: qty 2

## 2017-04-12 MED ORDER — HYDROMORPHONE HCL 1 MG/ML IJ SOLN
0.2500 mg | INTRAMUSCULAR | Status: DC | PRN
  Administered 2017-04-12 (×2): 0.5 mg via INTRAVENOUS

## 2017-04-12 MED ORDER — DEXAMETHASONE SODIUM PHOSPHATE 10 MG/ML IJ SOLN
INTRAMUSCULAR | Status: AC
  Filled 2017-04-12: qty 1

## 2017-04-12 MED ORDER — BISACODYL 10 MG RE SUPP: 10.0000 mg | Freq: Every day | RECTAL | Status: DC | PRN

## 2017-04-12 MED ORDER — ASPIRIN 81 MG PO CHEW: 81.0000 mg | CHEWABLE_TABLET | Freq: Two times a day (BID) | ORAL | 0 refills | Status: AC

## 2017-04-12 MED ORDER — LACTATED RINGERS IV SOLN
INTRAVENOUS | Status: DC
  Administered 2017-04-12 (×2): via INTRAVENOUS

## 2017-04-12 MED ORDER — FERROUS SULFATE 325 (65 FE) MG PO TABS: 325.0000 mg | ORAL_TABLET | Freq: Three times a day (TID) | ORAL | Status: DC

## 2017-04-12 MED ORDER — METOCLOPRAMIDE HCL 5 MG/ML IJ SOLN: 5.0000 mg | Freq: Three times a day (TID) | INTRAMUSCULAR | Status: DC | PRN

## 2017-04-12 MED ORDER — ONDANSETRON HCL 4 MG/2ML IJ SOLN
INTRAMUSCULAR | Status: DC | PRN
  Administered 2017-04-12: 4 mg via INTRAVENOUS

## 2017-04-12 MED ORDER — CEFAZOLIN SODIUM-DEXTROSE 2-4 GM/100ML-% IV SOLN
2.0000 g | Freq: Four times a day (QID) | INTRAVENOUS | Status: AC
  Administered 2017-04-12 (×2): 2 g via INTRAVENOUS
  Filled 2017-04-12 (×2): qty 100

## 2017-04-12 MED ORDER — ONDANSETRON HCL 4 MG PO TABS: 4.0000 mg | ORAL_TABLET | Freq: Four times a day (QID) | ORAL | Status: DC | PRN

## 2017-04-12 SURGICAL SUPPLY — 37 items
ADH SKN CLS APL DERMABOND .7 (GAUZE/BANDAGES/DRESSINGS) ×1
BAG SPEC THK2 15X12 ZIP CLS (MISCELLANEOUS) ×1
BAG ZIPLOCK 12X15 (MISCELLANEOUS) ×2 IMPLANT
BLADE SAG 18X100X1.27 (BLADE) ×3 IMPLANT
CAPT HIP TOTAL 2 ×2 IMPLANT
CLOTH BEACON ORANGE TIMEOUT ST (SAFETY) ×3 IMPLANT
COVER PERINEAL POST (MISCELLANEOUS) ×3 IMPLANT
COVER SURGICAL LIGHT HANDLE (MISCELLANEOUS) ×3 IMPLANT
DERMABOND ADVANCED (GAUZE/BANDAGES/DRESSINGS) ×2
DERMABOND ADVANCED .7 DNX12 (GAUZE/BANDAGES/DRESSINGS) ×1 IMPLANT
DRAPE STERI IOBAN 125X83 (DRAPES) ×3 IMPLANT
DRAPE U-SHAPE 47X51 STRL (DRAPES) ×6 IMPLANT
DRESSING AQUACEL AG SP 3.5X10 (GAUZE/BANDAGES/DRESSINGS) ×1 IMPLANT
DRSG AQUACEL AG SP 3.5X10 (GAUZE/BANDAGES/DRESSINGS) ×3
DURAPREP 26ML APPLICATOR (WOUND CARE) ×3 IMPLANT
ELECT REM PT RETURN 15FT ADLT (MISCELLANEOUS) ×3 IMPLANT
GLOVE BIOGEL M STRL SZ7.5 (GLOVE) ×4 IMPLANT
GLOVE BIOGEL PI IND STRL 7.5 (GLOVE) ×1 IMPLANT
GLOVE BIOGEL PI INDICATOR 7.5 (GLOVE) ×10
GLOVE ECLIPSE 7.0 STRL STRAW (GLOVE) ×2 IMPLANT
GLOVE ECLIPSE 7.5 STRL STRAW (GLOVE) ×4 IMPLANT
GLOVE ORTHO TXT STRL SZ7.5 (GLOVE) ×3 IMPLANT
GLOVE SURG SS PI 7.5 STRL IVOR (GLOVE) ×2 IMPLANT
GOWN STRL REUS W/ TWL XL LVL3 (GOWN DISPOSABLE) IMPLANT
GOWN STRL REUS W/TWL LRG LVL3 (GOWN DISPOSABLE) ×3 IMPLANT
GOWN STRL REUS W/TWL XL LVL3 (GOWN DISPOSABLE) ×9 IMPLANT
HOLDER FOLEY CATH W/STRAP (MISCELLANEOUS) ×3 IMPLANT
PACK ANTERIOR HIP CUSTOM (KITS) ×3 IMPLANT
SLEEVE SURGEON STRL (DRAPES) ×2 IMPLANT
SUT MNCRL AB 4-0 PS2 18 (SUTURE) ×3 IMPLANT
SUT STRATAFIX 0 PDS 27 VIOLET (SUTURE) ×3
SUT VIC AB 1 CT1 36 (SUTURE) ×9 IMPLANT
SUT VIC AB 2-0 CT1 27 (SUTURE) ×6
SUT VIC AB 2-0 CT1 TAPERPNT 27 (SUTURE) ×2 IMPLANT
SUTURE STRATFX 0 PDS 27 VIOLET (SUTURE) ×1 IMPLANT
TRAY FOLEY CATH SILVER 14FR (SET/KITS/TRAYS/PACK) ×2 IMPLANT
YANKAUER SUCT BULB TIP 10FT TU (MISCELLANEOUS) ×2 IMPLANT

## 2017-04-12 NOTE — Anesthesia Postprocedure Evaluation (Signed)
Anesthesia Post Note  Patient: Diana Martin  Procedure(s) Performed: LEFT TOTAL HIP ARTHROPLASTY ANTERIOR APPROACH (Left Hip)     Patient location during evaluation: PACU Anesthesia Type: Spinal Level of consciousness: awake and sedated Pain management: pain level not controlled Vital Signs Assessment: post-procedure vital signs reviewed and stable Cardiovascular status: stable Postop Assessment: no apparent nausea or vomiting Anesthetic complications: no    Last Vitals:  Vitals:   04/12/17 0641 04/12/17 1047  BP: 108/86 90/60  Pulse: 87 78  Resp: 16 20  Temp: 36.7 C 36.9 C  SpO2: 100% 100%    Last Pain:  Vitals:   04/12/17 1100  TempSrc:   PainSc: 8    Pain Goal: Patients Stated Pain Goal: 0 (04/12/17 0644)               Rodolfo Notaro JR,JOHN Mateo Flow

## 2017-04-12 NOTE — Op Note (Signed)
NAME:  Diana Martin NO.: [id number]      MEDICAL RECORD NO.: [id number]      FACILITY:  [facility]      PHYSICIAN:  Mauri Pole  DATE OF BIRTH:  [birth year]     DATE OF PROCEDURE:  04/12/2017                                 OPERATIVE REPORT         PREOPERATIVE DIAGNOSIS: Left  hip osteoarthritis.      POSTOPERATIVE DIAGNOSIS:  Left hip osteoarthritis.      PROCEDURE:  Left total hip replacement through an anterior approach   utilizing DePuy THR system, component size 50 mm pinnacle cup, a size 32+4  neutral   Altrex liner, a size 6 Hi Tri Lock stem with a 32+5 delta ceramic   ball.      SURGEON:  Pietro Cassis. Alvan Dame, M.D.      ASSISTANT:  Nehemiah Massed, PA-C     ANESTHESIA:  Spinal.      SPECIMENS:  None.      COMPLICATIONS:  None.      BLOOD LOSS:  325 cc     DRAINS:  None.      INDICATION OF THE PROCEDURE:  Diana Martin is a 72 y.o. female who had   presented to office for evaluation of left hip pain.  Radiographs revealed   progressive degenerative changes with bone-on-bone   articulation to the  hip joint.  The patient had painful limited range of   motion significantly affecting their overall quality of life.  The patient was failing to    respond to conservative measures, and at this point was ready   to proceed with more definitive measures.  The patient has noted progressive   degenerative changes in his hip, progressive problems and dysfunction   with regarding the hip prior to surgery.  Consent was obtained for   benefit of pain relief.  Specific risk of infection, DVT, component   failure, dislocation, need for revision surgery, as well discussion of   the anterior versus posterior approach were reviewed.  Consent was   obtained for benefit of anterior pain relief through an anterior   approach.      PROCEDURE IN DETAIL:  The patient was brought to operative theater.   Once adequate anesthesia, preoperative  antibiotics, 2 gm of Ancef, 1 gm of Tranexamic Acid, and 10 mg of Decadron administered.   The patient was positioned supine on the OSI Hanna table.  Once adequate   padding of boney process was carried out, we had predraped out the hip, and  used fluoroscopy to confirm orientation of the pelvis and position.      The left hip was then prepped and draped from proximal iliac crest to   mid thigh with shower curtain technique.      Time-out was performed identifying the patient, planned procedure, and   extremity.     An incision was then made 2 cm distal and lateral to the   anterior superior iliac spine extending over the orientation of the   tensor fascia lata muscle and sharp dissection was carried down to the   fascia of the muscle and protractor placed in the soft tissues.  The fascia was then incised.  The muscle belly was identified and swept   laterally and retractor placed along the superior neck.  Following   cauterization of the circumflex vessels and removing some pericapsular   fat, a second cobra retractor was placed on the inferior neck.  A third   retractor was placed on the anterior acetabulum after elevating the   anterior rectus.  A L-capsulotomy was along the line of the   superior neck to the trochanteric fossa, then extended proximally and   distally.  Tag sutures were placed and the retractors were then placed   intracapsular.  We then identified the trochanteric fossa and   orientation of my neck cut, confirmed this radiographically   and then made a neck osteotomy with the femur on traction.  The femoral   head was removed without difficulty or complication.  Traction was let   off and retractors were placed posterior and anterior around the   acetabulum.      The labrum and foveal tissue were debrided.  I began reaming with a 43mm   reamer and reamed up to 42mm reamer with good bony bed preparation and a 43mm   cup was chosen.  The final 52mm Pinnacle cup  was then impacted under fluoroscopy  to confirm the depth of penetration and orientation with respect to   abduction.  A screw was placed followed by the hole eliminator.  At this point I excised significant peri-acetabular osteophytes circumferentially (I do not recall this amount of osteophytes on her contralateral hip)The final   32+4 neutral Altrex liner was impacted with good visualized rim fit.  The cup was positioned anatomically within the acetabular portion of the pelvis.      At this point, the femur was rolled at 80 degrees.  Further capsule was   released off the inferior aspect of the femoral neck.  I then   released the superior capsule proximally.  The hook was placed laterally   along the femur and elevated manually and held in position with the bed   hook.  The leg was then extended and adducted with the leg rolled to 100   degrees of external rotation.  Once the proximal femur was fully   exposed, I used a box osteotome to set orientation.  I then began   broaching with the starting chili pepper broach and passed this by hand and then broached up to 6.  With the 6 broach in place I chose a high offset neck and did several trial reductions.  The offset was appropriate, leg lengths   appeared to be equal best matching to her other hip which had previously been replaced, confirmed radiographically.   Given these findings, I went ahead and dislocated the hip, repositioned all   retractors and positioned the right hip in the extended and abducted position.  The final 6 Hi Tri Lock stem was   chosen and it was impacted down to the level of neck cut.  Based on this   and the trial reduction, a 32+5 delta ceramic ball was chosen and   impacted onto a clean and dry trunnion, and the hip was reduced.  The   hip had been irrigated throughout the case again at this point.  I did   reapproximate the superior capsular leaflet to the anterior leaflet   using #1 Vicryl.  The fascia of the    tensor fascia lata muscle was then reapproximated using #1 Vicryl  and #0 Stratafix sutures.  The   remaining wound was closed with 2-0 Vicryl and running 4-0 Monocryl.   The hip was cleaned, dried, and dressed sterilely using Dermabond and   Aquacel dressing.  She was then brought   to recovery room in stable condition tolerating the procedure well.    Nehemiah Massed, PA-C was present for the entirety of the case involved from   preoperative positioning, perioperative retractor management, general   facilitation of the case, as well as primary wound closure as assistant.            Pietro Cassis Alvan Dame, M.D.        04/12/2017 10:13 AM

## 2017-04-12 NOTE — Anesthesia Preprocedure Evaluation (Addendum)
Anesthesia Evaluation  Patient identified by MRN, date of birth, ID band Patient awake    Reviewed: Allergy & Precautions, NPO status , Patient's Chart, lab work & pertinent test results  Airway Mallampati: I  TM Distance: >3 FB Neck ROM: Full    Dental no notable dental hx. (+) Teeth Intact, Dental Advisory Given   Pulmonary neg pulmonary ROS,    Pulmonary exam normal breath sounds clear to auscultation       Cardiovascular negative cardio ROS Normal cardiovascular exam Rhythm:Regular Rate:Normal     Neuro/Psych negative neurological ROS  negative psych ROS   GI/Hepatic negative GI ROS, Neg liver ROS,   Endo/Other  negative endocrine ROS  Renal/GU negative Renal ROS     Musculoskeletal   Abdominal Normal abdominal exam  (+)   Peds  Hematology negative hematology ROS (+)   Anesthesia Other Findings   Reproductive/Obstetrics                             Anesthesia Physical  Anesthesia Plan  ASA: II  Anesthesia Plan: Spinal   Post-op Pain Management:    Induction:   PONV Risk Score and Plan: 2 and Ondansetron and Dexamethasone  Airway Management Planned: Natural Airway and Simple Face Mask  Additional Equipment:   Intra-op Plan:   Post-operative Plan:   Informed Consent: I have reviewed the patients History and Physical, chart, labs and discussed the procedure including the risks, benefits and alternatives for the proposed anesthesia with the patient or authorized representative who has indicated his/her understanding and acceptance.     Plan Discussed with: CRNA and Surgeon  Anesthesia Plan Comments:         Anesthesia Quick Evaluation

## 2017-04-12 NOTE — Interval H&P Note (Signed)
History and Physical Interval Note:  04/12/2017 7:14 AM  Diana Martin  has presented today for surgery, with the diagnosis of Left hip osteoarthritis  The various methods of treatment have been discussed with the patient and family. After consideration of risks, benefits and other options for treatment, the patient has consented to  Procedure(s) with comments: LEFT TOTAL HIP ARTHROPLASTY ANTERIOR APPROACH (Left) - 70 mins as a surgical intervention .  The patient's history has been reviewed, patient examined, no change in status, stable for surgery.  I have reviewed the patient's chart and labs.  Questions were answered to the patient's satisfaction.     Mauri Pole

## 2017-04-12 NOTE — Addendum Note (Signed)
Addendum  created 04/12/17 1219 by Glory Buff, CRNA   Intraprocedure Flowsheets edited

## 2017-04-12 NOTE — Discharge Instructions (Signed)

## 2017-04-12 NOTE — Anesthesia Procedure Notes (Signed)
Spinal  Patient location during procedure: OR Start time: 04/12/2017 8:41 AM End time: 04/12/2017 8:45 AM Staffing Anesthesiologist: Lyn Hollingshead, MD Resident/CRNA: Glory Buff, CRNA Performed: resident/CRNA  Preanesthetic Checklist Completed: patient identified, site marked, surgical consent, pre-op evaluation, timeout performed, IV checked, risks and benefits discussed and monitors and equipment checked Spinal Block Patient position: sitting Prep: ChloraPrep Patient monitoring: heart rate, continuous pulse ox and blood pressure Approach: midline Location: L2-3 Injection technique: single-shot Needle Needle type: Pencan  Needle gauge: 24 G Needle length: 9 cm Needle insertion depth: 5 cm Assessment Sensory level: T6 Additional Notes Kit expiration date checked and verified, skin localized with 1% xylocaine, stick x1, - heme, - paraesthesia, + CSF pre and post injection, patient tolerated well.

## 2017-04-12 NOTE — Transfer of Care (Signed)
Immediate Anesthesia Transfer of Care Note  Patient: [name]  Procedure(s) Performed: LEFT TOTAL HIP ARTHROPLASTY ANTERIOR APPROACH (Left Hip)  Patient Location: PACU  Anesthesia Type:Regional  Level of Consciousness: awake  Airway & Oxygen Therapy: Patient Spontanous Breathing and Patient connected to face mask oxygen  Post-op Assessment: Report given to RN and Post -op Vital signs reviewed and stable  Post vital signs: Reviewed and stable  Last Vitals:  Vitals Value Taken Time  BP    Temp    Pulse 81 04/12/2017 10:47 AM  Resp 12 04/12/2017 10:47 AM  SpO2 100 % 04/12/2017 10:47 AM  Vitals shown include unvalidated device data.  Last Pain:  Vitals:   04/12/17 0644  TempSrc:   PainSc: 4       Patients Stated Pain Goal: 0 (67/54/49 2010)  Complications: No apparent anesthesia complications

## 2017-04-12 NOTE — Anesthesia Procedure Notes (Signed)
Date/Time: 04/12/2017 8:37 AM Performed by: Glory Buff, CRNA Oxygen Delivery Method: Simple face mask

## 2017-04-12 NOTE — Evaluation (Signed)
Physical Therapy Evaluation Patient Details Name: Diana Martin MRN: [id number] DOB: Sep 12, 1945 Today's Date: 04/12/2017   History of Present Illness  [name]  Clinical Impression  The patient moved to recliner with Rw. Felt a little dizzy so did not ambulate  Any fur. Pt admitted with above diagnosis. Pt currently with functional limitations due to the deficits listed below (see PT Problem List).  Pt will benefit from skilled PT to increase their independence and safety with mobility to allow discharge to the venue listed below.   ther    Follow Up Recommendations Follow surgeon's recommendation for DC plan and follow-up therapies    Equipment Recommendations  None recommended by PT    Recommendations for Other Services       Precautions / Restrictions Precautions Precautions: Fall Restrictions Weight Bearing Restrictions: No(WBAT)      Mobility  Bed Mobility Overal bed mobility: Needs Assistance Bed Mobility: Supine to Sit     Supine to sit: Min guard     General bed mobility comments: patient manages left leg  Transfers Overall transfer level: Needs assistance Equipment used: Rolling walker (2 wheeled) Transfers: Sit to/from Stand Sit to Stand: Min assist         General transfer comment: cues for safety  Ambulation/Gait Ambulation/Gait assistance: Min assist Ambulation Distance (Feet): 8 Feet Assistive device: Rolling walker (2 wheeled) Gait Pattern/deviations: Step-to pattern     General Gait Details: patient felt a littly dizzy so mobilized to recliner.   Stairs            Wheelchair Mobility    Modified Rankin (Stroke Patients Only)       Balance                                             Pertinent Vitals/Pain Pain Assessment: 0-10 Pain Score: 3  Pain Location: left htigh and hip Pain Descriptors / Indicators: Sore Pain Intervention(s): Limited activity within patient's tolerance;Monitored during  session;Premedicated before session;Repositioned    Home Living Family/patient expects to be discharged to:: Private residence Living Arrangements: Spouse/significant other Available Help at Discharge: Family Type of Home: House Home Access: Stairs to enter Entrance Stairs-Rails: None Entrance Stairs-Number of Steps: 3 Home Layout: One level Home Equipment: Environmental consultant - 2 wheels;Shower seat - built in      Prior Function Level of Independence: Independent               Journalist, newspaper        Extremity/Trunk Assessment   Upper Extremity Assessment Upper Extremity Assessment: Overall WFL for tasks assessed    Lower Extremity Assessment Lower Extremity Assessment: LLE deficits/detail LLE Deficits / Details: able to place left leg in hip and knee flexion in supine    Cervical / Trunk Assessment Cervical / Trunk Assessment: Normal  Communication   Communication: No difficulties  Cognition Arousal/Alertness: Awake/alert Behavior During Therapy: WFL for tasks assessed/performed Overall Cognitive Status: Within Functional Limits for tasks assessed                                        General Comments      Exercises     Assessment/Plan    PT Assessment Patient needs continued PT services  PT Problem List Decreased strength;Decreased range of  motion;Decreased activity tolerance;Decreased balance;Decreased mobility;Decreased knowledge of precautions;Decreased safety awareness;Decreased knowledge of use of DME;Pain       PT Treatment Interventions DME instruction;Gait training;Stair training;Functional mobility training;Therapeutic activities;Therapeutic exercise;Patient/family education    PT Goals (Current goals can be found in the Care Plan section)  Acute Rehab PT Goals Patient Stated Goal: to go home tomorrow PT Goal Formulation: With patient Time For Goal Achievement: 04/16/17 Potential to Achieve Goals: Good    Frequency 7X/week    Barriers to discharge        Co-evaluation               AM-PAC PT "6 Clicks" Daily Activity  Outcome Measure Difficulty turning over in bed (including adjusting bedclothes, sheets and blankets)?: A Little Difficulty moving from lying on back to sitting on the side of the bed? : A Little Difficulty sitting down on and standing up from a chair with arms (e.g., wheelchair, bedside commode, etc,.)?: A Little Help needed moving to and from a bed to chair (including a wheelchair)?: A Little Help needed walking in hospital room?: A Little Help needed climbing 3-5 steps with a railing? : A Little 6 Click Score: 18    End of Session   Activity Tolerance: Patient tolerated treatment well Patient left: in chair;with call bell/phone within reach Nurse Communication: Mobility status PT Visit Diagnosis: Unsteadiness on feet (R26.81);Pain Pain - Right/Left: Left Pain - part of body: Hip    Time: 9179-1505 PT Time Calculation (min) (ACUTE ONLY): 19 min   Charges:   PT Evaluation $PT Eval Low Complexity: 1 Low     PT G CodesTresa Endo PT [phone] }  [name] 04/12/2017, 3:16 PM

## 2017-04-12 NOTE — Plan of Care (Signed)
Expressive of her needs. Plan of care discussed.

## 2017-04-13 ENCOUNTER — Encounter (HOSPITAL_COMMUNITY): Payer: Self-pay | Admitting: Orthopedic Surgery

## 2017-04-13 LAB — CBC
HCT: 27.2 % — ABNORMAL LOW (ref 36.0–46.0)
Hemoglobin: 9.3 g/dL — ABNORMAL LOW (ref 12.0–15.0)
MCH: 31.8 pg (ref 26.0–34.0)
MCHC: 34.2 g/dL (ref 30.0–36.0)
MCV: 93.2 fL (ref 78.0–100.0)
PLATELETS: 145 10*3/uL — AB (ref 150–400)
RBC: 2.92 MIL/uL — ABNORMAL LOW (ref 3.87–5.11)
RDW: 12.9 % (ref 11.5–15.5)
WBC: 7.2 10*3/uL (ref 4.0–10.5)

## 2017-04-13 LAB — BASIC METABOLIC PANEL
Anion gap: 5 (ref 5–15)
BUN: 12 mg/dL (ref 6–20)
CALCIUM: 8.5 mg/dL — AB (ref 8.9–10.3)
CO2: 26 mmol/L (ref 22–32)
CREATININE: 0.68 mg/dL (ref 0.44–1.00)
Chloride: 109 mmol/L (ref 101–111)
GFR calc Af Amer: >60 mL/min (ref >60)
GLUCOSE: 97 mg/dL (ref 65–99)
Potassium: 4 mmol/L (ref 3.5–5.1)
SODIUM: 140 mmol/L (ref 135–145)

## 2017-04-13 NOTE — Progress Notes (Signed)
Physical Therapy Treatment Patient Details Name: Diana Martin MRN: [id number] DOB: 1945/06/29 Today's Date: 04/13/2017    History of Present Illness [name]    PT Comments    Exceptional progress  Today and  ready for Dc.   Follow Up Recommendations  Follow surgeon's recommendation for DC plan and follow-up therapies     Equipment Recommendations  None recommended by PT    Recommendations for Other Services       Precautions / Restrictions Precautions Precautions: Fall    Mobility  Bed Mobility Overal bed mobility: Independent             General bed mobility comments: patient manages left leg  Transfers   Equipment used: Rolling walker (2 wheeled);None             General transfer comment: cues for safety, patient is able to stand without RW  Ambulation/Gait Ambulation/Gait assistance: Supervision Ambulation Distance (Feet): 100 Feet Assistive device: Rolling walker (2 wheeled)       General Gait Details: no problemsambulating. able to take steps without RW   Stairs Stairs: Yes   Stair Management: Step to pattern;Forwards;One rail Left Number of Stairs: 2 General stair comments: no assistance  Wheelchair Mobility    Modified Rankin (Stroke Patients Only)       Balance                                            Cognition Arousal/Alertness: Awake/alert                                            Exercises Total Joint Exercises Ankle Circles/Pumps: AROM;Both;10 reps Quad Sets: AROM;Both;10 reps Short Arc Quad: AROM;Left Heel Slides: AROM;Left;10 reps Hip ABduction/ADduction: AROM;Left;10 reps Marching in Standing: AROM;Left;10 reps Standing Hip Extension: AROM;Left;10 reps    General Comments        Pertinent Vitals/Pain Pain Score: 0-No pain    Home Living                      Prior Function            PT Goals (current goals can now be found in the care plan section)  Progress towards PT goals: Progressing toward goals    Frequency           PT Plan Current plan remains appropriate    Co-evaluation              AM-PAC PT "6 Clicks" Daily Activity  Outcome Measure    Difficulty moving from lying on back to sitting on the side of the bed? : None Difficulty sitting down on and standing up from a chair with arms (e.g., wheelchair, bedside commode, etc,.)?: None Help needed moving to and from a bed to chair (including a wheelchair)?: A Little Help needed walking in hospital room?: A Little Help needed climbing 3-5 steps with a railing? : A Little 6 Click Score: 17    End of Session   Activity Tolerance: Patient tolerated treatment well Patient left: in chair;with call bell/phone within reach Nurse Communication: Mobility status PT Visit Diagnosis: Unsteadiness on feet (R26.81);Pain Pain - Right/Left: Left Pain - part of body: Hip     Time: 1013-1050 PT Time  Calculation (min) (ACUTE ONLY): 37 min  Charges:  $Gait Training: 8-22 mins $Therapeutic Exercise: 8-22 mins                    G CodesTresa Endo PT [phone]    [name] 04/13/2017, 11:00 AM

## 2017-04-13 NOTE — Care Management Note (Signed)
Case Management Note  Patient Details  Name: Diana Martin MRN: [id number] Date of Birth: [birth year]  Subjective/Objective: Noted d/c home with f/u with surgery office. No CM needs.                   Action/Plan:d/c home.   Expected Discharge Date:  04/13/17               Expected Discharge Plan:  Home/Self Care  In-House Referral:     Discharge planning Services  CM Consult  Post Acute Care Choice:    Choice offered to:     DME Arranged:    DME Agency:     HH Arranged:    HH Agency:     Status of Service:  Completed, signed off  If discussed at [employer] of Stay Meetings, dates discussed:    Additional Comments:  Dessa Phi, RN 04/13/2017, 11:29 AM

## 2017-04-13 NOTE — Discharge Summary (Signed)
Physician Discharge Summary  Patient ID: Diana Martin MRN: [id number] DOB/AGE: 71-Feb-1947 71 y.o.  Admit date: 04/12/2017 Discharge date:  04/13/2017  Procedures:  Procedure(s) (LRB): LEFT TOTAL HIP ARTHROPLASTY ANTERIOR APPROACH (Left)  Attending Physician:  Dr. Paralee Cancel   Admission Diagnoses:   Left hip primary OA / pain  Discharge Diagnoses:  Principal Problem:   S/P left THA, AA Active Problems:   S/P right THA, AA  Past Medical History:  Diagnosis Date  . Arthritis   . Atrophic vaginitis   . Breast cancer ([place])   . Broken wrist    left  . Osteopenia   . Vitamin D deficiency     HPI:    Diana Martin, 72 y.o. female, has a history of pain and functional disability in the left hip(s) due to arthritis and patient has failed non-surgical conservative treatments for greater than 12 weeks to include NSAID's and/or analgesics and activity modification.  Onset of symptoms was gradual starting <1 year ago with rapidlly worsening course since that time.The patient noted prior procedures of the hip to include arthroplasty on the right hip in 2018.  Patient currently rates pain in the left hip at 8 out of 10 with activity. Patient has worsening of pain with activity and weight bearing, trendelenberg gait, pain that interfers with activities of daily living and pain with passive range of motion. Patient has evidence of periarticular osteophytes and joint space narrowing by imaging studies. This condition presents safety issues increasing the risk of falls.  There is no current active infection.   Risks, benefits and expectations were discussed with the patient.  Risks including but not limited to the risk of anesthesia, blood clots, nerve damage, blood vessel damage, failure of the prosthesis, infection and up to and including death.  Patient understand the risks, benefits and expectations and wishes to proceed with surgery.   PCP: Leighton Ruff, MD   Discharged Condition:  good  Hospital Course:  Patient underwent the above stated procedure on 04/12/2017. Patient tolerated the procedure well and brought to the recovery room in good condition and subsequently to the floor.  POD #1 BP: 136/71 ; Pulse: 89 ; Temp: 98.3 F (36.8 C) ; Resp: 15 Patient reports pain as mild, pain controlled.  No events throughout the night. She has been through this before with the other hip.  Ready to be discharged home.  Dorsiflexion/plantar flexion intact, incision: dressing C/D/I, no cellulitis present and compartment soft.   LABS  Basename    HGB     9.3  HCT     27.2    Discharge Exam: General appearance: alert, cooperative and no distress Extremities: Homans sign is negative, no sign of DVT, no edema, redness or tenderness in the calves or thighs and no ulcers, gangrene or trophic changes  Disposition: Home with follow up in 2 weeks   Follow-up Information    Paralee Cancel, MD. Schedule an appointment as soon as possible for a visit in 2 weeks.   Specialty:  Orthopedic Surgery Contact information: [address] [place] [address] [phone]           Discharge Instructions    Call MD / Call 911   Complete by:  As directed    If you experience chest pain or shortness of breath, CALL 911 and be transported to the hospital emergency room.  If you develope a fever above 101 F, pus (white drainage) or increased drainage or redness at  the wound, or calf pain, call your surgeon's office.   Change dressing   Complete by:  As directed    Maintain surgical dressing until follow up in the clinic. If the edges start to pull up, may reinforce with tape. If the dressing is no longer working, may remove and cover with gauze and tape, but must keep the area dry and clean.  Call with any questions or concerns.   Constipation Prevention   Complete by:  As directed    Drink plenty of fluids.  Prune juice may be helpful.  You may use a stool softener, such as  Colace (over the counter) 100 mg twice a day.  Use MiraLax (over the counter) for constipation as needed.   Diet - low sodium heart healthy   Complete by:  As directed    Discharge instructions   Complete by:  As directed    Maintain surgical dressing until follow up in the clinic. If the edges start to pull up, may reinforce with tape. If the dressing is no longer working, may remove and cover with gauze and tape, but must keep the area dry and clean.  Follow up in 2 weeks at [facility]. Call with any questions or concerns.   Increase activity slowly as tolerated   Complete by:  As directed    Weight bearing as tolerated with assist device (walker, cane, etc) as directed, use it as long as suggested by your surgeon or therapist, typically at least 4-6 weeks.   TED hose   Complete by:  As directed    Use stockings (TED hose) for 2 weeks on both leg(s).  You may remove them at night for sleeping.      Allergies as of 04/13/2017   No Known Allergies     Medication List    STOP taking these medications   Estradiol 10 MCG Tabs vaginal tablet Commonly known as:  VAGIFEM     TAKE these medications   aspirin 81 MG chewable tablet Commonly known as:  ASPIRIN CHILDRENS Chew 1 tablet (81 mg total) by mouth 2 (two) times daily. Take for 4 weeks, then resume regular dose.   calcium-vitamin D 500-200 MG-UNIT tablet Commonly known as:  OSCAL WITH D Take 1 tablet by mouth daily.   docusate sodium 100 MG capsule Commonly known as:  COLACE Take 1 capsule (100 mg total) by mouth 2 (two) times daily.   ferrous sulfate 325 (65 FE) MG tablet Commonly known as:  FERROUSUL Take 1 tablet (325 mg total) by mouth 3 (three) times daily with meals.   HYDROcodone-acetaminophen 7.5-325 MG tablet Commonly known as:  NORCO Take 1-2 tablets by mouth every 4 (four) hours as needed for moderate pain.   methocarbamol 500 MG tablet Commonly known as:  ROBAXIN Take 1 tablet (500 mg total) by  mouth every 6 (six) hours as needed for muscle spasms.   multivitamin tablet Take 1 tablet by mouth daily.   polyethylene glycol packet Commonly known as:  MIRALAX / GLYCOLAX Take 17 g by mouth 2 (two) times daily.            Discharge Care Instructions  (From admission, onward)        Start     Ordered   04/13/17 0000  Change dressing    Comments:  Maintain surgical dressing until follow up in the clinic. If the edges start to pull up, may reinforce with tape. If the dressing is no longer working, may  remove and cover with gauze and tape, but must keep the area dry and clean.  Call with any questions or concerns.   04/13/17 0801       Signed: [place] Pugh. Royale Swamy   PA-C  04/13/2017, 8:01 AM

## 2017-04-13 NOTE — Progress Notes (Signed)
     Subjective: 1 Day Post-Op Procedure(s) (LRB): LEFT TOTAL HIP ARTHROPLASTY ANTERIOR APPROACH (Left)   Patient reports pain as mild, pain controlled.  No events throughout the night. She has been through this before with the other hip.  Ready to be discharged home.   Objective:   VITALS:   Vitals:   04/12/17 2154 04/13/17 0551  BP: (!) 107/58 136/71  Pulse: 88 89  Resp: 15 15  Temp: (!) 97.3 F (36.3 C) 98.3 F (36.8 C)  SpO2: 98% 100%    Dorsiflexion/Plantar flexion intact Incision: dressing C/D/I No cellulitis present Compartment soft  LABS Recent Labs    04/13/17 0537  HGB 9.3*  HCT 27.2*  WBC 7.2  PLT 145*    Recent Labs    04/13/17 0537  NA 140  K 4.0  BUN 12  CREATININE 0.68  GLUCOSE 97     Assessment/Plan: 1 Day Post-Op Procedure(s) (LRB): LEFT TOTAL HIP ARTHROPLASTY ANTERIOR APPROACH (Left) Foley cath d/c'ed Advance diet Up with therapy D/C IV fluids Discharge home Follow up in 2 weeks at [facility]. Follow up with OLIN,Leane Loring D in 2 weeks.  Contact information:  [facility] [address] Ari Bernabei   PAC  04/13/2017, 7:59 AM

## 2017-08-29 DIAGNOSIS — M810 Age-related osteoporosis without current pathological fracture: Secondary | ICD-10-CM

## 2017-08-29 HISTORY — DX: Age-related osteoporosis without current pathological fracture: M81.0

## 2018-04-13 ENCOUNTER — Ambulatory Visit: Payer: Medicare Other | Admitting: Certified Nurse Midwife

## 2018-06-26 ENCOUNTER — Encounter: Payer: Self-pay | Admitting: Certified Nurse Midwife

## 2018-06-26 ENCOUNTER — Ambulatory Visit (INDEPENDENT_AMBULATORY_CARE_PROVIDER_SITE_OTHER): Payer: Medicare Other | Admitting: Certified Nurse Midwife

## 2018-06-26 ENCOUNTER — Other Ambulatory Visit: Payer: Self-pay

## 2018-06-26 VITALS — BP 110/70 | HR 72 | Temp 97.7°F | Resp 16 | Ht 65.25 in | Wt 117.0 lb

## 2018-06-26 DIAGNOSIS — N952 Postmenopausal atrophic vaginitis: Secondary | ICD-10-CM

## 2018-06-26 DIAGNOSIS — Z01419 Encounter for gynecological examination (general) (routine) without abnormal findings: Secondary | ICD-10-CM

## 2018-06-26 MED ORDER — ESTRADIOL 10 MCG VA TABS: 1.0000 | ORAL_TABLET | VAGINAL | 12 refills | Status: DC

## 2018-06-26 NOTE — Progress Notes (Signed)
72 y.o. G0P0000 Married  Caucasian Fe here for annual exam. Post menopausal no vaginal bleeding. Vaginal dryness same, still using Vagifem with good results. Sees Dr. Drema Dallas and has had exam with labs, all normal. Did have hip replacement on left, doing well. Eating well. Has mammogram scheduled. No health issues today.  Patient's last menstrual period was 01/12/1996 (approximate).          Sexually active: No.  The current method of family planning is post menopausal status.    Exercising: Yes.    walking Smoker:  no  Review of Systems  Constitutional: Negative.   HENT: Negative.   Eyes: Negative.   Respiratory: Negative.   Cardiovascular: Negative.   Gastrointestinal: Negative.   Genitourinary: Negative.   Musculoskeletal: Negative.   Skin: Negative.   Neurological: Negative.   Endo/Heme/Allergies: Negative.   Psychiatric/Behavioral: Negative.     Health Maintenance: Pap:  03-16-16 neg HPV HR neg History of Abnormal Pap: no MMG:  2019 neg per patient Self Breast exams: yes Colonoscopy:  2010 f/u 57yrs scheduled already BMD:   2019 osteopenia TDaP:  2011 Shingles: had done Pneumonia: 2015 Hep C and HIV: hep c neg 2017 Labs: no   reports that she has never smoked. She has never used smokeless tobacco. She reports current alcohol use of about 7.0 standard drinks of alcohol per week. She reports that she does not use drugs.  Past Medical History:  Diagnosis Date  . Arthritis   . Atrophic vaginitis   . Breast cancer ([place])   . Broken wrist    left  . Osteopenia   . Vitamin D deficiency     Past Surgical History:  Procedure Laterality Date  . BREAST BIOPSY Left 2000  . BREAST CYST ASPIRATION Left 1995  . BREAST LUMPECTOMY Left 07/16/2003  . CATARACT EXTRACTION, BILATERAL  03/2014, 04/2014  . PILONIDAL CYST EXCISION  age 89  . SENTINEL LYMPH NODE BIOPSY Left 07/16/03  . TONSILLECTOMY  as child  . TOTAL HIP ARTHROPLASTY Right 05/25/2016   Procedure: RIGHT TOTAL HIP  ARTHROPLASTY ANTERIOR APPROACH;  Surgeon: Paralee Cancel, MD;  Location: [facility];  Service: Orthopedics;  Laterality: Right;  . TOTAL HIP ARTHROPLASTY Left 04/12/2017   Procedure: LEFT TOTAL HIP ARTHROPLASTY ANTERIOR APPROACH;  Surgeon: Paralee Cancel, MD;  Location: [facility];  Service: Orthopedics;  Laterality: Left;  70 mins  . WISDOM TOOTH EXTRACTION  in college    Current Outpatient Medications  Medication Sig Dispense Refill  . calcium-vitamin D (OSCAL WITH D) 500-200 MG-UNIT per tablet Take 1 tablet by mouth daily. 30 tablet 3  . Estradiol 10 MCG TABS vaginal tablet Place vaginally. Insert tablet into vagina twice weekly    . Multiple Vitamin (MULTIVITAMIN) tablet Take 1 tablet by mouth daily.     No current facility-administered medications for this visit.     Family History  Problem Relation Age of Onset  . Lung cancer Father 63  . Heart disease Mother   . Cervical cancer Paternal Aunt   . Stomach cancer Maternal Grandmother     ROS:  Pertinent items are noted in HPI.  Otherwise, a comprehensive ROS was negative.  Exam:   BP 110/70   Pulse 72   Temp 97.7 F (36.5 C) (Skin)   Resp 16   Ht 5' 5.25" (1.657 m)   Wt 117 lb (53.1 kg)   LMP 01/12/1996 (Approximate)   BMI 19.32 kg/m  Height: 5' 5.25" (165.7 cm) Ht Readings from Last 3 Encounters:  06/26/18 5' 5.25" (1.657 m)  04/12/17 5\' 2"  (1.575 m)  04/07/17 5' 5.25" (1.657 m)    General appearance: alert, cooperative and appears stated age Head: Normocephalic, without obvious abnormality, atraumatic Neck: no adenopathy, supple, symmetrical, trachea midline and thyroid normal to inspection and palpation Lungs: clear to auscultation bilaterally Breasts: normal appearance, no masses or tenderness, No nipple retraction or dimpling, No nipple discharge or bleeding, No axillary or supraclavicular adenopathy Heart: regular rate and rhythm Abdomen: soft, non-tender; no masses,  no organomegaly Extremities: extremities normal,  atraumatic, no cyanosis or edema Skin: Skin color, texture, turgor normal. No rashes or lesions Lymph nodes: Cervical, supraclavicular, and axillary nodes normal. No abnormal inguinal nodes palpated Neurologic: Grossly normal   Pelvic: External genitalia:  no lesions              Urethra:  normal appearing urethra with no masses, tenderness or lesions              Bartholin's and Skene's: normal                 Vagina: normal appearing vagina with normal color and discharge, no lesions              Cervix: no cervical motion tenderness and no lesions              Pap taken: No. Bimanual Exam:  Uterus:  normal size, contour, position, consistency, mobility, non-tender and anteverted              Adnexa: normal adnexa and no mass, fullness, tenderness               Rectovaginal: Confirms               Anus:  normal sphincter tone, no lesions  Chaperone present: yes  A:  Well Woman with normal exam  Post menopausal  No HRT  Atrophic vaginitis with Vagifem use with good results  History of left breast cancer 15 years ago  Osteopenia with PCP managment  P:   Reviewed health and wellness pertinent to exam  Aware of need to advise if vaginal bleeding  Discussed risks/benefits/warning signs with use, desires refill  Rx Vagifem see order with instructions  Stressed SBE and mammogram  Work on regular walking and calcium in diet  Pap smear: no   counseled on breast self exam, mammography screening, feminine hygiene, menopause, adequate intake of calcium and vitamin D, diet and exercise  return annually or prn  An After Visit Summary was printed and given to the patient.

## 2018-12-27 IMAGING — DX DG PORTABLE PELVIS
1 series · 1 of 1 positions shown · non-contrast
Comparison: Intraoperative images 0 907 hours today. Pelvis
radiograph 05/25/2016.

CLINICAL DATA: 72-year-old female s/p left hip replacement.

EXAM:
PORTABLE PELVIS 1-2 VIEWS

[pelvis ap]
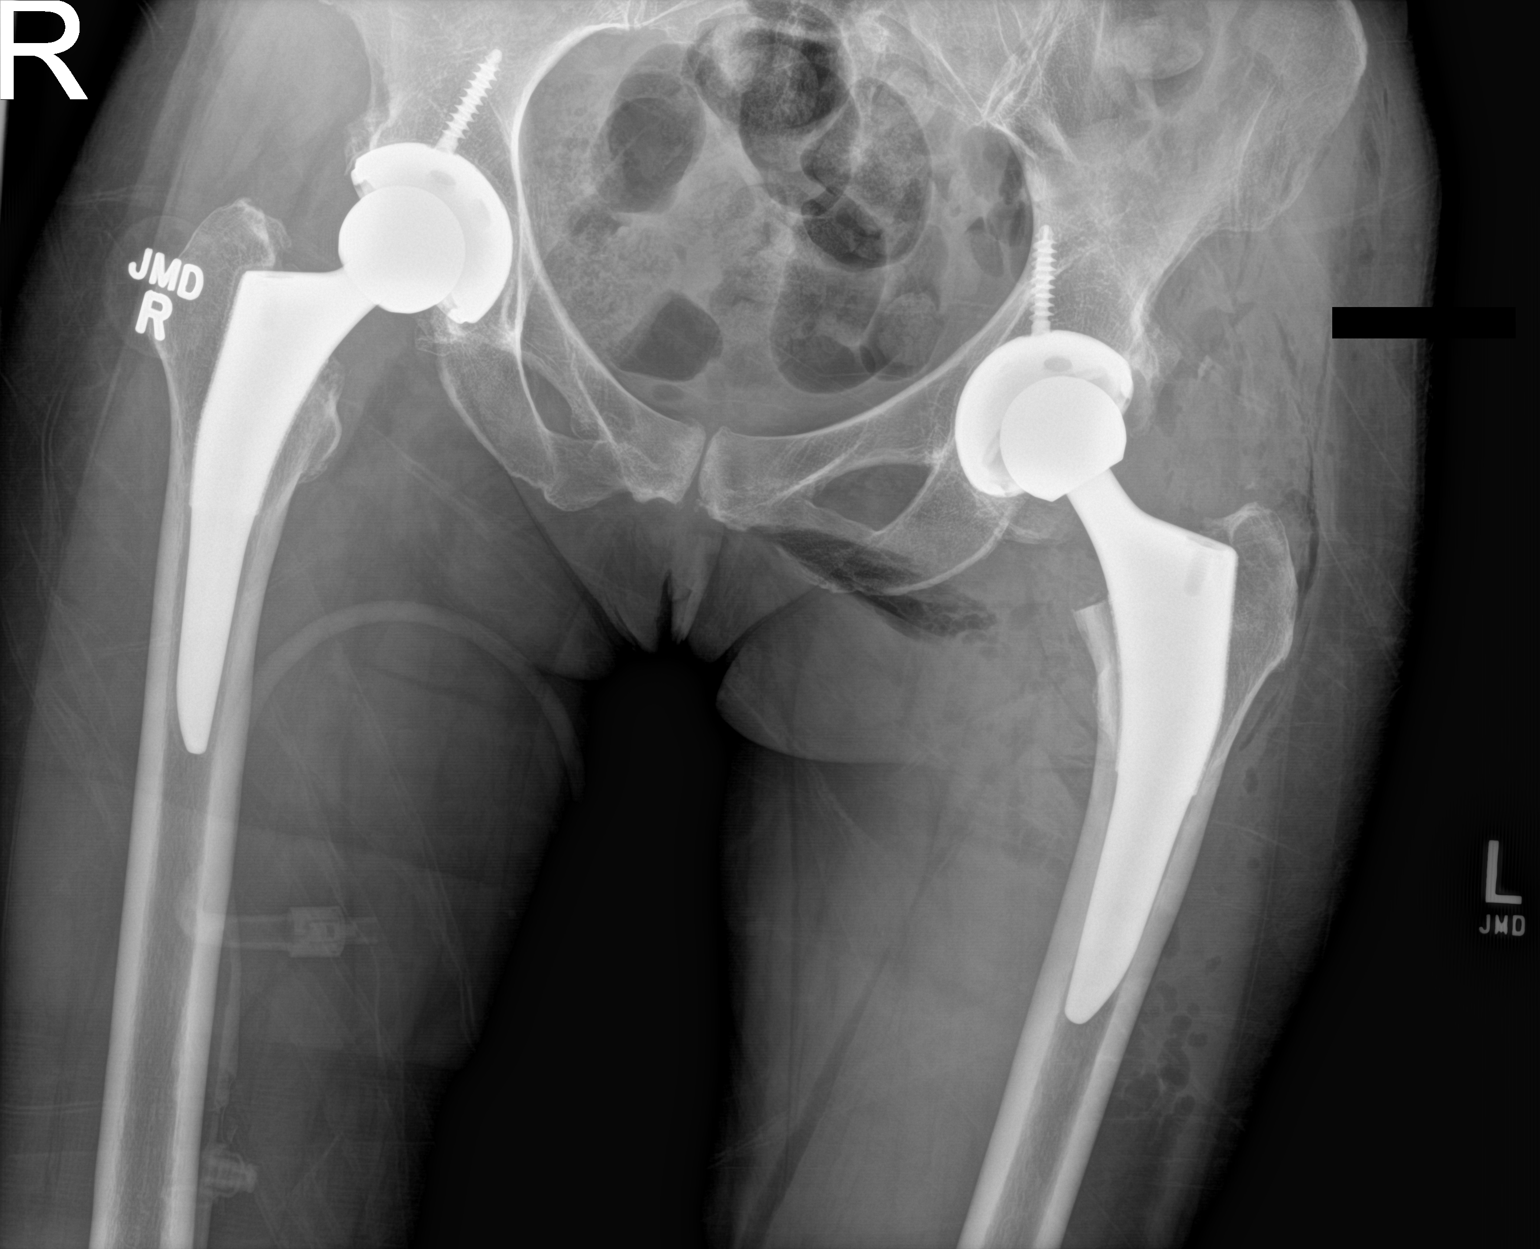

[1 of 1 positions shown; findings below may reference images not displayed]

FINDINGS: Portable AP view at 9898 hours. Preexisting right total hip
arthroplasty. New left total hip arthroplasty. Hardware components
appear intact and normally aligned in the AP projection. Surrounding
postoperative changes to the soft tissues with multifocal
subcutaneous gas. No unexpected osseous changes.
IMPRESSION: Left total hip arthroplasty with no adverse features.

## 2019-04-03 ENCOUNTER — Encounter: Payer: Self-pay | Admitting: Certified Nurse Midwife

## 2019-05-16 IMAGING — US US CAROTID DUPLEX BILAT
1 series · 13 of 24 positions shown · non-contrast
Comparison: None.

CLINICAL DATA: Asymptomatic carotid bruit

EXAM:
BILATERAL CAROTID DUPLEX ULTRASOUND
TECHNIQUE: Gray scale imaging, color Doppler and duplex ultrasound were
performed of bilateral carotid and vertebral arteries in the neck.

[Series 1: us carotid duplex bilat · 0.05mm/px · 13 of 46 slices shown]
[im 1/46]
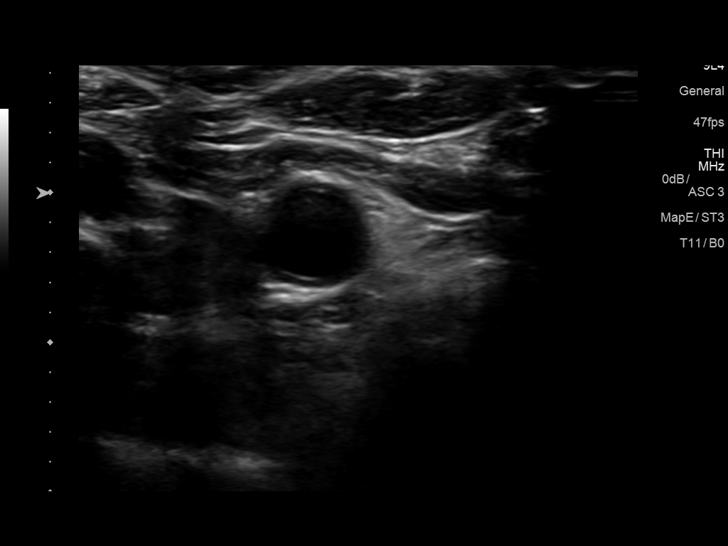
[im 4/46]
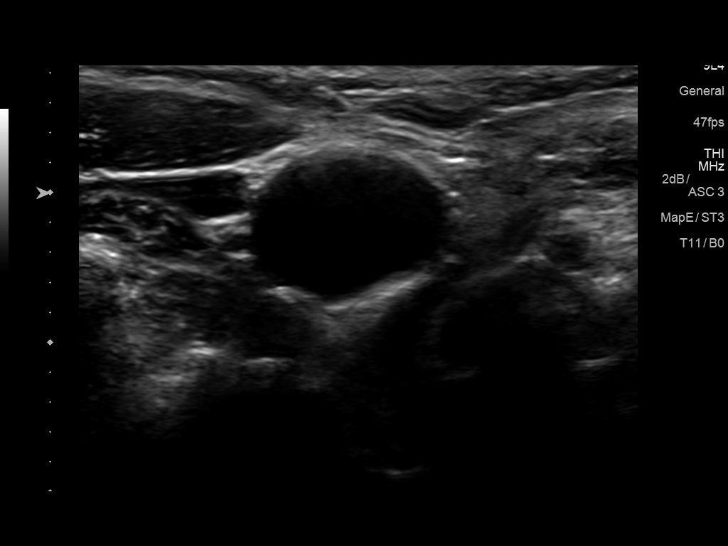
[im 8/46]
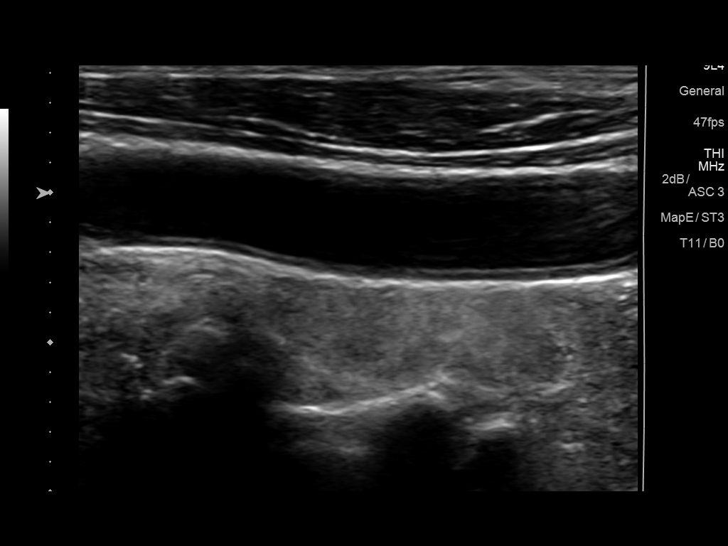
[im 12/46]
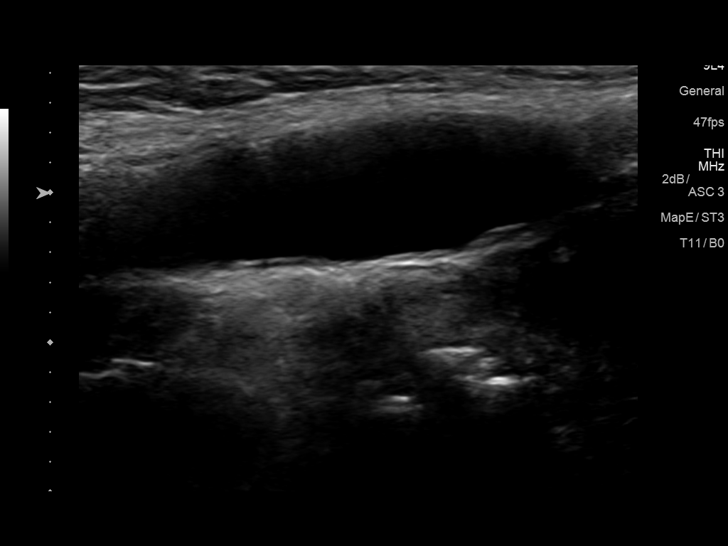
[im 16/46]
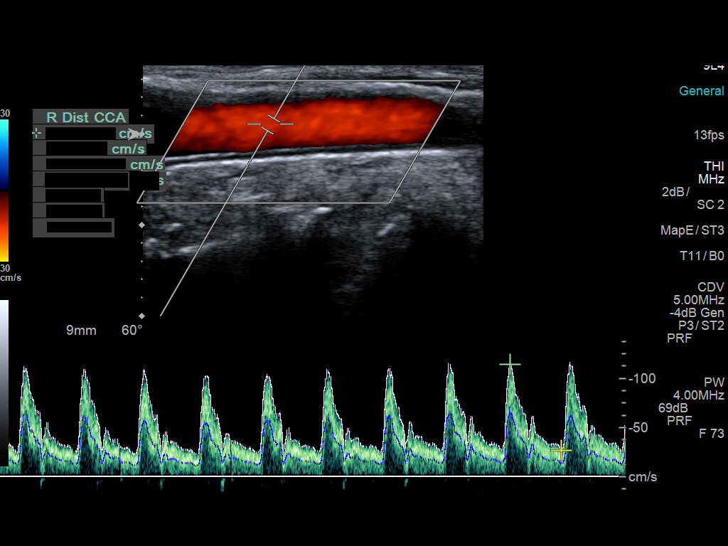
[im 20/46]
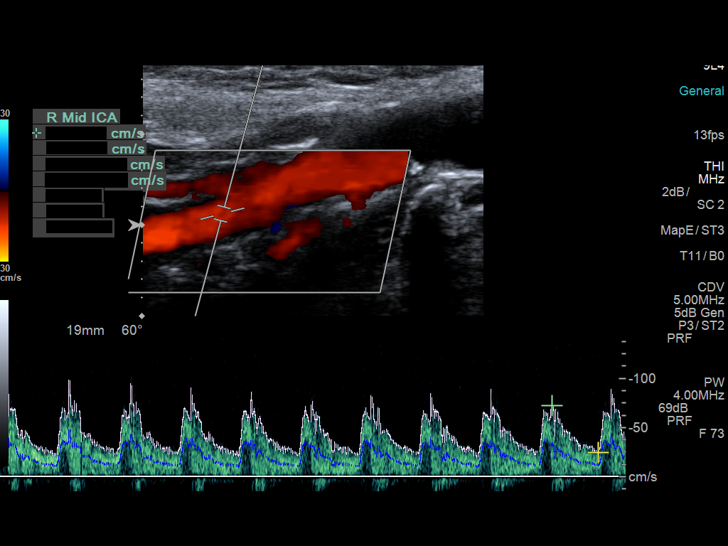
[im 24/46]
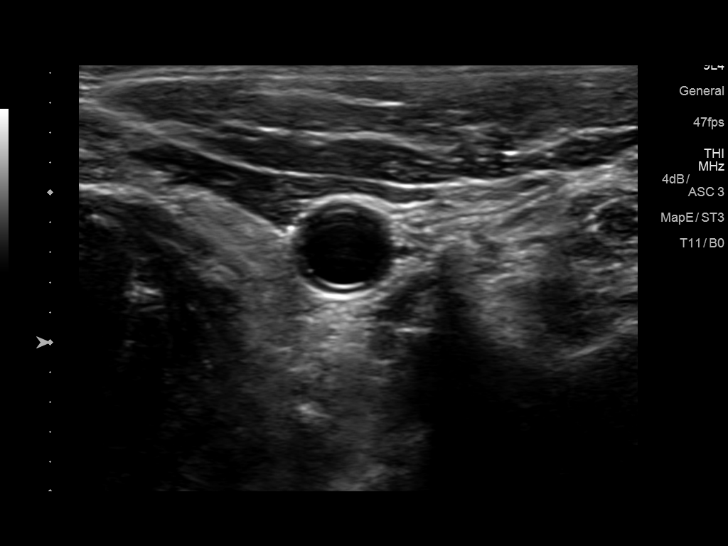
[im 26/46]
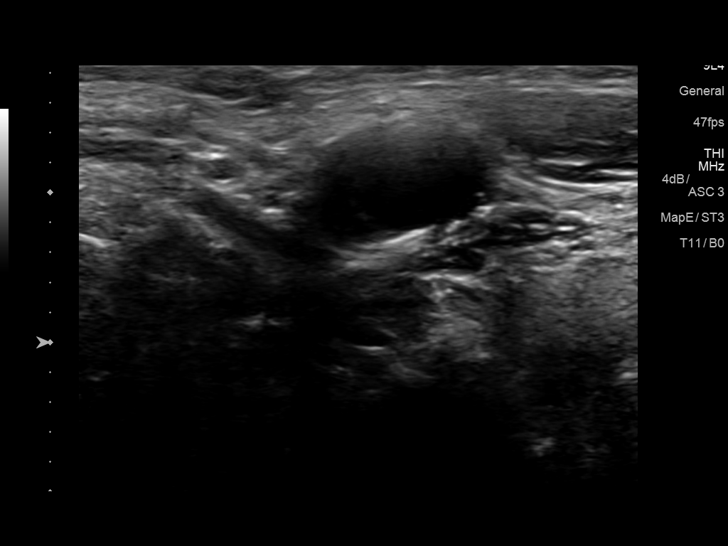
[im 30/46]
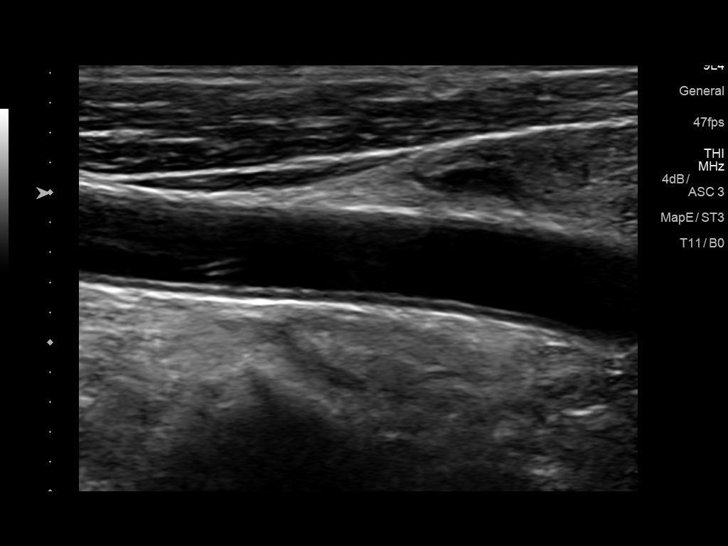
[im 34/46]
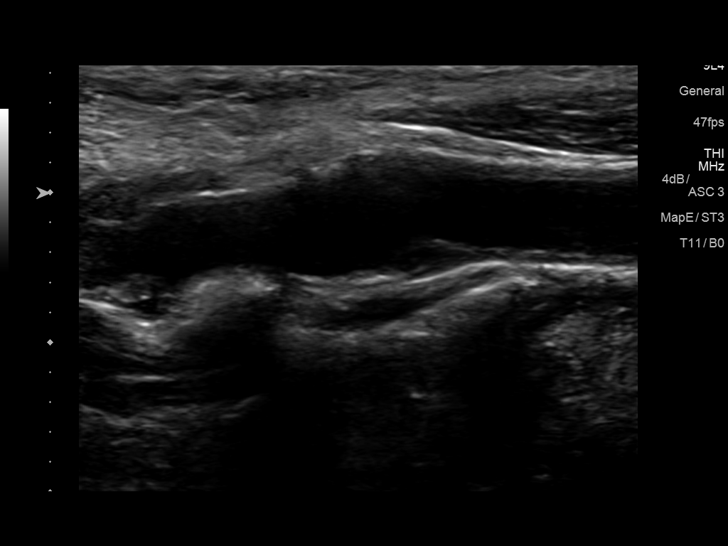
[im 38/46]
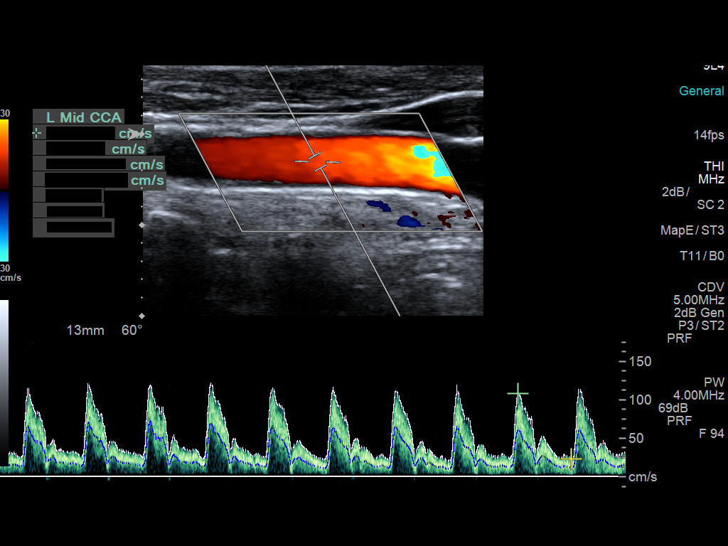
[im 42/46]
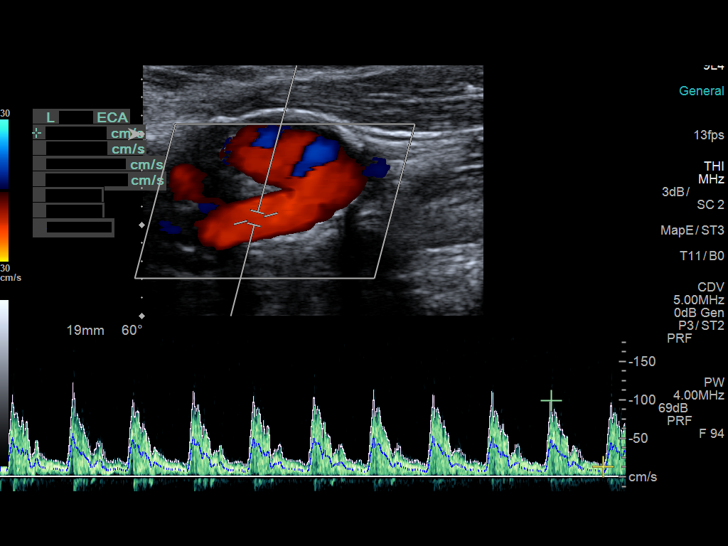
[im 46/46]
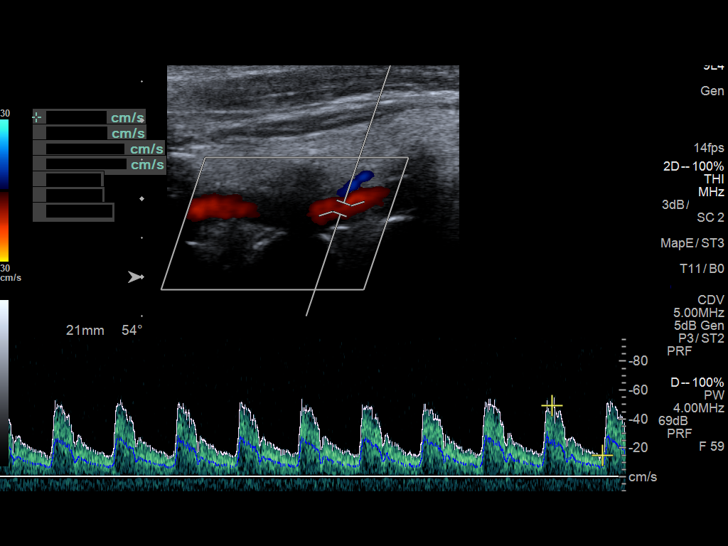

[13 of 24 positions shown; findings below may reference images not displayed]

FINDINGS: Criteria: Quantification of carotid stenosis is based on velocity
parameters that correlate the residual internal carotid diameter
with NASCET-based stenosis levels, using the diameter of the distal
internal carotid lumen as the denominator for stenosis measurement.

The following velocity measurements were obtained:

RIGHT

ICA:  75/12 cm/sec

CCA:  114/27 cm/sec

SYSTOLIC ICA/CCA RATIO:

DIASTOLIC ICA/CCA RATIO:

ECA:  98 cm/sec

LEFT

ICA:  103/19 cm/sec

CCA:  125/26 cm/sec

SYSTOLIC ICA/CCA RATIO:

DIASTOLIC ICA/CCA RATIO:

ECA:  99 cm/sec

RIGHT CAROTID ARTERY: Minor carotid intimal thickening and echogenic
shadowing plaque formation. No hemodynamically significant right ICA
stenosis, velocity elevation, or turbulent flow. Degree of narrowing
less than 50%.

RIGHT VERTEBRAL ARTERY:  Antegrade

LEFT CAROTID ARTERY: Similar carotid intimal thickening and
scattered minor echogenic plaque formation. No hemodynamically
significant left ICA stenosis, velocity elevation, or turbulent
flow.

LEFT VERTEBRAL ARTERY:  Antegrade
IMPRESSION: Minor carotid atherosclerosis. No hemodynamically significant ICA
stenosis. Degree of narrowing less than 50% bilaterally by
ultrasound criteria.

Patent antegrade vertebral flow bilaterally.

## 2019-06-27 ENCOUNTER — Other Ambulatory Visit: Payer: Self-pay

## 2019-06-27 ENCOUNTER — Ambulatory Visit: Payer: Medicare Other | Admitting: Certified Nurse Midwife

## 2019-06-27 NOTE — Progress Notes (Signed)
74 y.o. [place] Married Caucasian female here for annual exam.    Received Covid vaccine.   Using Vagifem tablets.  Patient had an estrogen sensitive breast cancer.   Not sexually active.  She has some dryness and irritation.   PCP: Leighton Ruff, MD    Patient's last menstrual period was 01/12/1996 (approximate).           Sexually active: No.  The current method of family planning is post menopausal status.    Exercising: Yes.    weights 2x/week and walks daily\ Smoker:  no  Health Maintenance: Pap: 03-16-16 Neg:Neg HR HPV. 11-06-13 Neg History of abnormal Pap:  no MMG: 08-26-16 Hx Lt.Br.Ca/lumpectomy--Neg/density C/Birads2.  Will get report from [place].  Colonoscopy: 07-17-18 polyps;next due 2027 BMD: 2019  Result :Osteopenia TDaP:  PCP Gardasil:   no HIV: Unsure Hep C: 03-11-15 Neg Screening Labs:  PCP.    reports that she has never smoked. She has never used smokeless tobacco. She reports current alcohol use of about 7.0 standard drinks of alcohol per week. She reports that she does not use drugs.  Past Medical History:  Diagnosis Date  . Arthritis   . Atrophic vaginitis   . Breast cancer ([place])   . Broken wrist    left  . Osteopenia   . Vitamin D deficiency     Past Surgical History:  Procedure Laterality Date  . BREAST BIOPSY Left 2000  . BREAST CYST ASPIRATION Left 1995  . BREAST LUMPECTOMY Left 07/16/2003  . CATARACT EXTRACTION, BILATERAL  03/2014, 04/2014  . PILONIDAL CYST EXCISION  age 39  . SENTINEL LYMPH NODE BIOPSY Left 07/16/03  . TONSILLECTOMY  as child  . TOTAL HIP ARTHROPLASTY Right 05/25/2016   Procedure: RIGHT TOTAL HIP ARTHROPLASTY ANTERIOR APPROACH;  Surgeon: Paralee Cancel, MD;  [facility];  Service: Orthopedics;  Laterality: Right;  . TOTAL HIP ARTHROPLASTY Left 04/12/2017   Procedure: LEFT TOTAL HIP ARTHROPLASTY ANTERIOR APPROACH;  Surgeon: Paralee Cancel, MD;  [facility]: [facility];  Service: Orthopedics;  Laterality: Left;  70 mins  . WISDOM  TOOTH EXTRACTION  in college    Current Outpatient Medications  Medication Sig Dispense Refill  . calcium-vitamin D (OSCAL WITH D) 500-200 MG-UNIT per tablet Take 1 tablet by mouth daily. 30 tablet 3  . Estradiol 10 MCG TABS vaginal tablet Place 1 tablet (10 mcg total) vaginally 2 (two) times a week. Insert tablet into vagina twice weekly 8 tablet 12  . Multiple Vitamin (MULTIVITAMIN) tablet Take 1 tablet by mouth daily.     No current facility-administered medications for this visit.    Family History  Problem Relation Age of Onset  . Lung cancer Father 55  . Heart disease Mother   . Breast cancer Sister 68  . Cervical cancer Paternal Aunt   . Stomach cancer Maternal Grandmother     Review of Systems  All other systems reviewed and are negative.   Exam:   BP 138/68   Pulse 90   Temp 97.6 F (36.4 C) (Temporal)   Resp 14   Ht 5' 4.5" (1.638 m)   Wt 121 lb (54.9 kg)   LMP 01/12/1996 (Approximate)   BMI 20.45 kg/m     General appearance: alert, cooperative and appears stated age Head: normocephalic, without obvious abnormality, atraumatic Neck: no adenopathy, supple, symmetrical, trachea midline and thyroid normal to inspection and palpation Lungs: clear to auscultation bilaterally Breasts: normal appearance, no masses or tenderness, No nipple retraction or dimpling, No nipple  discharge or bleeding, No axillary adenopathy Heart: regular rate and rhythm Abdomen: soft, non-tender; no masses, no organomegaly Extremities: extremities normal, atraumatic, no cyanosis or edema Skin: skin color, texture, turgor normal. No rashes or lesions Lymph nodes: cervical, supraclavicular, and axillary nodes normal. Neurologic: grossly normal  Pelvic: External genitalia:  Generalized erythema.                No abnormal inguinal nodes palpated.              Urethra:  normal appearing urethra with no masses, tenderness or lesions              Bartholins and Skenes: normal                  Vagina: normal appearing vagina with normal color and discharge, no lesions              Cervix: no lesions              Pap taken: Yes.   Bimanual Exam:  Uterus:  normal size, contour, position, consistency, mobility, non-tender              Adnexa: no mass, fullness, tenderness              Rectal exam: Yes.  .  Confirms.              Anus:  normal sphincter tone, no lesions  Chaperone was present for exam.  Assessment:   Well woman visit with normal exam. Vaginal atrophy.  Using Vagifem.  Hx left breast cancer.  Estrogen sensitive. Osteopenia.  PCP following.   Plan: Mammogram screening discussed.  Will update records. Will get copy of her BMD.  Self breast awareness reviewed. Pap and HR HPV as above. Guidelines for Calcium, Vitamin D, regular exercise program including cardiovascular and weight bearing exercise. Will stop Vagifem.  Start coconut oil. Follow up annually and prn.    After visit summary provided.

## 2019-06-28 ENCOUNTER — Ambulatory Visit (INDEPENDENT_AMBULATORY_CARE_PROVIDER_SITE_OTHER): Payer: Medicare PPO | Admitting: Obstetrics and Gynecology

## 2019-06-28 ENCOUNTER — Other Ambulatory Visit (HOSPITAL_COMMUNITY)
Admission: RE | Admit: 2019-06-28 | Discharge: 2019-06-28 | Disposition: A | Discharge status: Home / Self Care | Payer: Medicare PPO | Source: Ambulatory Visit | Attending: Obstetrics and Gynecology | Admitting: Obstetrics and Gynecology

## 2019-06-28 ENCOUNTER — Encounter: Payer: Self-pay | Admitting: Obstetrics and Gynecology

## 2019-06-28 VITALS — BP 138/68 | HR 90 | Temp 97.6°F | Resp 14 | Ht 64.5 in | Wt 121.0 lb

## 2019-06-28 DIAGNOSIS — Z01419 Encounter for gynecological examination (general) (routine) without abnormal findings: Secondary | ICD-10-CM | POA: Insufficient documentation

## 2019-06-28 NOTE — Patient Instructions (Signed)

## 2019-06-29 LAB — CYTOLOGY - PAP: Diagnosis: NEGATIVE

## 2019-07-02 ENCOUNTER — Encounter: Payer: Self-pay | Admitting: Obstetrics and Gynecology

## 2020-05-07 DIAGNOSIS — M2042 Other hammer toe(s) (acquired), left foot: Secondary | ICD-10-CM | POA: Diagnosis not present

## 2020-05-07 DIAGNOSIS — M21611 Bunion of right foot: Secondary | ICD-10-CM | POA: Diagnosis not present

## 2020-05-07 DIAGNOSIS — M2041 Other hammer toe(s) (acquired), right foot: Secondary | ICD-10-CM | POA: Diagnosis not present

## 2020-05-07 DIAGNOSIS — M21612 Bunion of left foot: Secondary | ICD-10-CM | POA: Diagnosis not present

## 2020-07-10 ENCOUNTER — Ambulatory Visit (INDEPENDENT_AMBULATORY_CARE_PROVIDER_SITE_OTHER): Payer: Medicare PPO | Admitting: Obstetrics and Gynecology

## 2020-07-10 ENCOUNTER — Other Ambulatory Visit: Payer: Self-pay

## 2020-07-10 VITALS — BP 136/72 | HR 90 | Ht 64.5 in | Wt 118.0 lb

## 2020-07-10 DIAGNOSIS — Z008 Encounter for other general examination: Secondary | ICD-10-CM

## 2020-07-10 DIAGNOSIS — Z853 Personal history of malignant neoplasm of breast: Secondary | ICD-10-CM

## 2020-07-10 DIAGNOSIS — Z01419 Encounter for gynecological examination (general) (routine) without abnormal findings: Secondary | ICD-10-CM

## 2020-07-10 DIAGNOSIS — Z1239 Encounter for other screening for malignant neoplasm of breast: Secondary | ICD-10-CM

## 2020-07-10 DIAGNOSIS — N952 Postmenopausal atrophic vaginitis: Secondary | ICD-10-CM | POA: Diagnosis not present

## 2020-07-10 DIAGNOSIS — Z01411 Encounter for gynecological examination (general) (routine) with abnormal findings: Secondary | ICD-10-CM

## 2020-07-10 NOTE — Patient Instructions (Signed)

## 2020-07-10 NOTE — Progress Notes (Signed)
75 y.o. [place] Married Caucasian female here for annual exam.    Doing ok off Vagifem.  Stopped this with her last visit after we reviewed potential effect on breast cancer. She feels very comfortable with stopping this.  No vulvar irritation.  Likes to work in the yard and does get warm outside.   Used to Psychologist, occupational at [employer].   Received her Covid booster.   PCP:  Kathyrn Lass, MD - [facility]  Patient's last menstrual period was 01/12/1996 (approximate).           Sexually active: No.  The current method of family planning is post menopausal status.    Exercising: Yes.     Walks 7 days/week and weights 2x/week Smoker:  no  Health Maintenance: Pap: 06/28/19 - normal, neg HR HPV, 03-16-16 Neg:Neg HR HPV. 11-06-13 Neg History of abnormal Pap:  no MMG:  09-05-19 3D/Neg/Birads2.   Colonoscopy: 07-17-18 polyp;next 7 years BMD: 09-05-19  Result :Osteoporosis of wrist, osteopenia of spine. TDaP: PCP Gardasil:   no HIV:no Hep C:no Screening Labs:  PCP. Shingrix:  completed.    reports that she has never smoked. She has never used smokeless tobacco. She reports current alcohol use of about 7.0 standard drinks of alcohol per week. She reports that she does not use drugs.  Past Medical History:  Diagnosis Date   Arthritis    Atrophic vaginitis    Breast cancer ([place])    Broken wrist    left   Osteoporosis 08/29/2017   T score of R radius: -3.3, T score of spine: -1.3   Vitamin D deficiency     Past Surgical History:  Procedure Laterality Date   BREAST BIOPSY Left 2000   BREAST CYST ASPIRATION Left 1995   BREAST LUMPECTOMY Left 07/16/2003   CATARACT EXTRACTION, BILATERAL  03/2014, 04/2014   PILONIDAL CYST EXCISION  age 38   SENTINEL LYMPH NODE BIOPSY Left 07/16/03   TONSILLECTOMY  as child   TOTAL HIP ARTHROPLASTY Right 05/25/2016   Procedure: RIGHT TOTAL HIP ARTHROPLASTY ANTERIOR APPROACH;  Surgeon: Paralee Cancel, MD;  [facility];  Service: Orthopedics;  Laterality: Right;    TOTAL HIP ARTHROPLASTY Left 04/12/2017   Procedure: LEFT TOTAL HIP ARTHROPLASTY ANTERIOR APPROACH;  Surgeon: Paralee Cancel, MD;  [facility];  Service: Orthopedics;  Laterality: Left;  70 mins   WISDOM TOOTH EXTRACTION  in college    Current Outpatient Medications  Medication Sig Dispense Refill   calcium-vitamin D (OSCAL WITH D) 500-200 MG-UNIT per tablet Take 1 tablet by mouth daily. (Patient taking differently: Take 1 tablet by mouth 2 (two) times daily.) 30 tablet 3   Multiple Vitamin (MULTIVITAMIN) tablet Take 1 tablet by mouth daily.     No current facility-administered medications for this visit.    Family History  Problem Relation Age of Onset   Lung cancer Father 31   Heart disease Mother    Breast cancer Sister 51   Cervical cancer Paternal Aunt    Stomach cancer Maternal Grandmother     Review of Systems  All other systems reviewed and are negative.  Exam:   BP 136/72   Pulse 90   Ht 5' 4.5" (1.638 m)   Wt 118 lb (53.5 kg)   LMP 01/12/1996 (Approximate)   SpO2 99%   BMI 19.94 kg/m     General appearance: alert, cooperative and appears stated age Head: normocephalic, without obvious abnormality, atraumatic Neck: no adenopathy, supple, symmetrical, trachea midline and thyroid normal to  inspection and palpation Lungs: clear to auscultation bilaterally Breasts: right - normal appearance, no masses or tenderness, No nipple retraction or dimpling, No nipple discharge or bleeding, No axillary adenopathy Left - scar and radiation change of the skin.  Heart: regular rate and rhythm Abdomen: soft, non-tender; no masses, no organomegaly Extremities: extremities normal, atraumatic, no cyanosis or edema Skin: skin color, texture, turgor normal. No rashes or lesions Lymph nodes: cervical, supraclavicular, and axillary nodes normal. Neurologic: grossly normal  Pelvic: External genitalia:  no lesions.  Generalized erythema skin tone.               No abnormal inguinal  nodes palpated.              Urethra:  normal appearing urethra with no masses, tenderness or lesions              Bartholins and Skenes: normal                 Vagina: normal appearing vagina with normal color and discharge, no lesions              Cervix: no lesions              Pap taken: no. Bimanual Exam:  Uterus:  normal size, contour, position, consistency, mobility, non-tender              Adnexa: no mass, fullness, tenderness              Rectal exam: yes.  Confirms.              Anus:  normal sphincter tone, no lesions  Chaperone was present for exam.  Assessment:    History of breast cancer.  Estrogen sensitive. Screening breast exam.  Pelvic exam with abnormal finding.  Hx vaginal atrophy.  Off vagifem.  Skin change of the vulva which is asymptomatic and does not show any lesions. Rectal exam. Osteoporosis.  PCP following.  Plan: Mammogram screening discussed. Self breast awareness reviewed. Pap next year.  Guidelines for Calcium, Vitamin D, regular exercise program including cardiovascular and weight bearing exercise. BMD next year.  We discussed using neutral products on the vulvar area and trying cotton underwear. Avoid pad use if possible. I recommend continuing off of local vaginal estrogens.  Follow up annually and prn.    After visit summary provided.   20 min total time was spent for this patient encounter, including preparation, face-to-face counseling with the patient, coordination of care, and documentation of the encounter.

## 2020-07-12 ENCOUNTER — Encounter: Payer: Self-pay | Admitting: Obstetrics and Gynecology

## 2020-07-21 ENCOUNTER — Ambulatory Visit: Payer: Medicare PPO | Admitting: Obstetrics and Gynecology

## 2020-08-25 DIAGNOSIS — M2042 Other hammer toe(s) (acquired), left foot: Secondary | ICD-10-CM | POA: Diagnosis not present

## 2020-08-25 DIAGNOSIS — M21612 Bunion of left foot: Secondary | ICD-10-CM | POA: Diagnosis not present

## 2020-08-25 DIAGNOSIS — M2041 Other hammer toe(s) (acquired), right foot: Secondary | ICD-10-CM | POA: Diagnosis not present

## 2020-08-25 DIAGNOSIS — M21611 Bunion of right foot: Secondary | ICD-10-CM | POA: Diagnosis not present

## 2020-09-19 DIAGNOSIS — Z1231 Encounter for screening mammogram for malignant neoplasm of breast: Secondary | ICD-10-CM | POA: Diagnosis not present

## 2020-09-22 ENCOUNTER — Encounter: Payer: Self-pay | Admitting: Obstetrics and Gynecology

## 2020-10-01 DIAGNOSIS — Z961 Presence of intraocular lens: Secondary | ICD-10-CM | POA: Diagnosis not present

## 2020-10-01 DIAGNOSIS — H5213 Myopia, bilateral: Secondary | ICD-10-CM | POA: Diagnosis not present

## 2020-12-15 DIAGNOSIS — M810 Age-related osteoporosis without current pathological fracture: Secondary | ICD-10-CM | POA: Diagnosis not present

## 2020-12-15 DIAGNOSIS — Z853 Personal history of malignant neoplasm of breast: Secondary | ICD-10-CM | POA: Diagnosis not present

## 2020-12-15 DIAGNOSIS — E7800 Pure hypercholesterolemia, unspecified: Secondary | ICD-10-CM | POA: Diagnosis not present

## 2020-12-15 DIAGNOSIS — Z0000 Encounter for general adult medical examination without abnormal findings: Secondary | ICD-10-CM | POA: Diagnosis not present

## 2020-12-15 DIAGNOSIS — E559 Vitamin D deficiency, unspecified: Secondary | ICD-10-CM | POA: Diagnosis not present

## 2021-04-24 DIAGNOSIS — Z79899 Other long term (current) drug therapy: Secondary | ICD-10-CM | POA: Diagnosis not present

## 2021-04-24 DIAGNOSIS — E7800 Pure hypercholesterolemia, unspecified: Secondary | ICD-10-CM | POA: Diagnosis not present

## 2021-07-13 ENCOUNTER — Ambulatory Visit: Payer: Medicare PPO | Admitting: Obstetrics and Gynecology

## 2021-08-03 NOTE — Progress Notes (Unsigned)
76 y.o. [id number] Married Caucasian female here for annual breat and pelvic exam.     PCP: Leighton Ruff, MD    Patient's last menstrual period was 01/12/1996 (approximate).           Sexually active: {yes no:314532}  The current method of family planning is post menopausal status.    Exercising: {yes no:314532}  {types:19826} Smoker:  no  Health Maintenance: Pap:  06-28-19 Neg:Neg HR HPV, 03-16-16 Neg:Neg HR HPV. 11-06-13 Neg History of abnormal Pap:  no MMG:  ***09-05-19 Neg/Birads2 Colonoscopy:  07-17-18 polyp;next 7 years BMD:  09-05-19  Result :Osteoporosis wrist and osteopenia spine TDaP:  PCP Gardasil:   n/a HIV:no Hep C:no Screening Labs:  Hb today: ***, Urine today: ***   reports that she has never smoked. She has never used smokeless tobacco. She reports current alcohol use of about 7.0 standard drinks of alcohol per week. She reports that she does not use drugs.  Past Medical History:  Diagnosis Date   Arthritis    Atrophic vaginitis    Breast cancer ([place])    Broken wrist    left   Osteoporosis 08/29/2017   T score of R radius: -3.3, T score of spine: -1.3   Vitamin D deficiency     Past Surgical History:  Procedure Laterality Date   BREAST BIOPSY Left 2000   BREAST CYST ASPIRATION Left 1995   BREAST LUMPECTOMY Left 07/16/2003   CATARACT EXTRACTION, BILATERAL  03/2014, 04/2014   PILONIDAL CYST EXCISION  age 89   SENTINEL LYMPH NODE BIOPSY Left 07/16/03   TONSILLECTOMY  as child   TOTAL HIP ARTHROPLASTY Right 05/25/2016   Procedure: RIGHT TOTAL HIP ARTHROPLASTY ANTERIOR APPROACH;  Surgeon: Paralee Cancel, MD;  [facility];  Service: Orthopedics;  Laterality: Right;   TOTAL HIP ARTHROPLASTY Left 04/12/2017   Procedure: LEFT TOTAL HIP ARTHROPLASTY ANTERIOR APPROACH;  Surgeon: Paralee Cancel, MD;  [facility];  Service: Orthopedics;  Laterality: Left;  70 mins   WISDOM TOOTH EXTRACTION  in college    Current Outpatient Medications  Medication Sig Dispense  Refill   calcium-vitamin D (OSCAL WITH D) 500-200 MG-UNIT per tablet Take 1 tablet by mouth daily. (Patient taking differently: Take 1 tablet by mouth 2 (two) times daily.) 30 tablet 3   Multiple Vitamin (MULTIVITAMIN) tablet Take 1 tablet by mouth daily.     No current facility-administered medications for this visit.    Family History  Problem Relation Age of Onset   Lung cancer Father 66   Heart disease Mother    Breast cancer Sister 28   Cervical cancer Paternal Aunt    Stomach cancer Maternal Grandmother     Review of Systems  Exam:   LMP 01/12/1996 (Approximate)     General appearance: alert, cooperative and appears stated age Head: normocephalic, without obvious abnormality, atraumatic Neck: no adenopathy, supple, symmetrical, trachea midline and thyroid normal to inspection and palpation Lungs: clear to auscultation bilaterally Breasts: normal appearance, no masses or tenderness, No nipple retraction or dimpling, No nipple discharge or bleeding, No axillary adenopathy Heart: regular rate and rhythm Abdomen: soft, non-tender; no masses, no organomegaly Extremities: extremities normal, atraumatic, no cyanosis or edema Skin: skin color, texture, turgor normal. No rashes or lesions Lymph nodes: cervical, supraclavicular, and axillary nodes normal. Neurologic: grossly normal  Pelvic: External genitalia:  no lesions              No abnormal inguinal nodes palpated.  Urethra:  normal appearing urethra with no masses, tenderness or lesions              Bartholins and Skenes: normal                 Vagina: normal appearing vagina with normal color and discharge, no lesions              Cervix: no lesions              Pap taken: {yes no:314532} Bimanual Exam:  Uterus:  normal size, contour, position, consistency, mobility, non-tender              Adnexa: no mass, fullness, tenderness              Rectal exam: {yes no:314532}.  Confirms.              Anus:  normal  sphincter tone, no lesions  Chaperone was present for exam:  ***  Assessment:   Well woman visit with gynecologic exam.   Plan: Mammogram screening discussed. Self breast awareness reviewed. Pap and HR HPV as above. Guidelines for Calcium, Vitamin D, regular exercise program including cardiovascular and weight bearing exercise.   Follow up annually and prn.   Additional counseling given.  {yes [id number]. _______ minutes face to face time of which over 50% was spent in counseling.    After visit summary provided.

## 2021-08-04 ENCOUNTER — Ambulatory Visit (INDEPENDENT_AMBULATORY_CARE_PROVIDER_SITE_OTHER): Payer: Medicare PPO | Admitting: Obstetrics and Gynecology

## 2021-08-04 ENCOUNTER — Other Ambulatory Visit (HOSPITAL_COMMUNITY)
Admission: RE | Admit: 2021-08-04 | Discharge: 2021-08-04 | Disposition: A | Discharge status: Home / Self Care | Payer: Medicare PPO | Source: Ambulatory Visit | Attending: Obstetrics and Gynecology | Admitting: Obstetrics and Gynecology

## 2021-08-04 ENCOUNTER — Encounter: Payer: Self-pay | Admitting: Obstetrics and Gynecology

## 2021-08-04 ENCOUNTER — Telehealth: Payer: Self-pay | Admitting: Obstetrics and Gynecology

## 2021-08-04 VITALS — HR 97 | Ht 64.5 in | Wt 113.0 lb

## 2021-08-04 DIAGNOSIS — Z853 Personal history of malignant neoplasm of breast: Secondary | ICD-10-CM | POA: Diagnosis not present

## 2021-08-04 DIAGNOSIS — Z124 Encounter for screening for malignant neoplasm of cervix: Secondary | ICD-10-CM | POA: Insufficient documentation

## 2021-08-04 DIAGNOSIS — M810 Age-related osteoporosis without current pathological fracture: Secondary | ICD-10-CM | POA: Diagnosis not present

## 2021-08-04 DIAGNOSIS — Z01419 Encounter for gynecological examination (general) (routine) without abnormal findings: Secondary | ICD-10-CM

## 2021-08-04 DIAGNOSIS — Z1151 Encounter for screening for human papillomavirus (HPV): Secondary | ICD-10-CM | POA: Diagnosis not present

## 2021-08-04 NOTE — Telephone Encounter (Signed)
I called patient and left message informing her order/appts request form faxed to [facility] and someone from [facility] will be giving her a call to schedule these appts.

## 2021-08-04 NOTE — Telephone Encounter (Signed)
Order request to schedule faxed to [facility] for BD test and mammo for same day in Sept.

## 2021-08-04 NOTE — Patient Instructions (Signed)

## 2021-08-04 NOTE — Telephone Encounter (Signed)
Please schedule a bone density and mammogram at [facility] for September for my patient.    She is already established there. She would like to do both on the same day if possible.   She has a history of left breast cancer and also has osteoporosis.

## 2021-08-11 LAB — CYTOLOGY - PAP
Comment: NEGATIVE
Diagnosis: NEGATIVE
High risk HPV: NEGATIVE

## 2021-08-17 NOTE — Telephone Encounter (Signed)
Thank you! Encounter reviewed and closed.  

## 2021-08-17 NOTE — Telephone Encounter (Signed)
Patient called to say she has her BD test and mammogram scheduled with [facility] for 10/02/2021 at 12:30pm.

## 2021-08-17 NOTE — Telephone Encounter (Signed)
Left voice mail message for patient confirming if she heard from [facility] and was scheduled.

## 2021-10-02 DIAGNOSIS — M8588 Other specified disorders of bone density and structure, other site: Secondary | ICD-10-CM | POA: Diagnosis not present

## 2021-10-02 DIAGNOSIS — Z1231 Encounter for screening mammogram for malignant neoplasm of breast: Secondary | ICD-10-CM | POA: Diagnosis not present

## 2021-10-02 DIAGNOSIS — Z853 Personal history of malignant neoplasm of breast: Secondary | ICD-10-CM | POA: Diagnosis not present

## 2021-10-02 DIAGNOSIS — M810 Age-related osteoporosis without current pathological fracture: Secondary | ICD-10-CM | POA: Diagnosis not present

## 2021-10-05 ENCOUNTER — Encounter: Payer: Self-pay | Admitting: Obstetrics and Gynecology

## 2021-10-05 DIAGNOSIS — H5213 Myopia, bilateral: Secondary | ICD-10-CM | POA: Diagnosis not present

## 2021-10-05 DIAGNOSIS — Z961 Presence of intraocular lens: Secondary | ICD-10-CM | POA: Diagnosis not present

## 2021-10-06 ENCOUNTER — Encounter: Payer: Self-pay | Admitting: Obstetrics and Gynecology

## 2021-10-09 ENCOUNTER — Encounter: Payer: Self-pay | Admitting: Obstetrics and Gynecology

## 2021-10-13 NOTE — Progress Notes (Deleted)
GYNECOLOGY  VISIT   HPI: 76 y.o.   Married  {Race/ethnicity:17218}  female   G0P0000 with Patient's last menstrual period was 01/12/1996 (approximate).   here for bone density consult. 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   Pain of left hip joint 03/31/2017   Osteoarthritis of left hip 03/31/2017   Closed fracture of left distal radius 07/20/2016   S/P right THA, AA 05/25/2016   Osteopenia 03/11/2015   Postmenopausal atrophic vaginitis 03/11/2015   SPRAIN AND STRAIN OF RIBS 01/09/2008   ANKLE INJURY, LEFT 01/09/2008   BREAST CANCER, HX OF 09/06/2006    Past Medical History:  Diagnosis Date   Arthritis    Atrophic vaginitis    Breast cancer ([place])    Broken wrist    left   Hyperlipidemia    Osteoporosis 10/02/2021   T score of radius:  -3.8   Vitamin D deficiency     Past Surgical History:  Procedure Laterality Date   BREAST BIOPSY Left 2000   BREAST CYST ASPIRATION Left 1995   BREAST LUMPECTOMY Left 07/16/2003   CATARACT EXTRACTION, BILATERAL  03/2014, 04/2014   PILONIDAL CYST EXCISION  age 26   SENTINEL LYMPH NODE BIOPSY Left 07/16/03   TONSILLECTOMY  as child   TOTAL HIP ARTHROPLASTY Right 05/25/2016   Procedure: RIGHT TOTAL HIP ARTHROPLASTY ANTERIOR APPROACH;  Surgeon: Paralee Cancel, MD;  [facility];  Service: Orthopedics;  Laterality: Right;   TOTAL HIP ARTHROPLASTY Left 04/12/2017   Procedure: LEFT TOTAL HIP ARTHROPLASTY ANTERIOR  APPROACH;  Surgeon: Paralee Cancel, MD;  [facility];  Service: Orthopedics;  Laterality: Left;  70 mins   WISDOM TOOTH EXTRACTION  in college    Current Outpatient Medications  Medication Sig Dispense Refill   calcium-vitamin D (OSCAL WITH D) 500-200 MG-UNIT per tablet Take 1 tablet by mouth daily. (Patient taking differently: Take 1 tablet by mouth 2 (two) times daily.) 30 tablet 3   Multiple Vitamin (MULTIVITAMIN) tablet Take 1 tablet by mouth daily.     rosuvastatin (CRESTOR) 5 MG tablet Take 5 mg by mouth daily.     No current facility-administered medications for this visit.     ALLERGIES: Patient has no known allergies.  Family History  Problem Relation Age of Onset   Lung cancer Father 20   Heart disease Mother    Breast cancer Sister 79   Cervical cancer Paternal Aunt    Stomach cancer Maternal Grandmother     Social History   Socioeconomic History   Marital status: Married    Spouse name: Not on file   Number of children: 0   Years of education: Not on file   Highest education  level: Not on file  Occupational History   Not on file  Tobacco Use   Smoking status: Never   Smokeless tobacco: Never  Vaping Use   Vaping Use: Never used  Substance and Sexual Activity   Alcohol use: Yes    Alcohol/week: 7.0 standard drinks of alcohol    Types: 7 Glasses of wine per week   Drug use: No   Sexual activity: Not Currently    Partners: Male    Birth control/protection: Post-menopausal    Comment: first intercourse >17, less than 5 partners  Other Topics Concern   Not on file  Social History Narrative   Not on file   Social Determinants of Health   Financial Resource Strain: Not on file  Food Insecurity: Not on file  Transportation Needs: Not on file  Physical Activity: Not on file  Stress: Not on file  Social Connections: Not on file  Intimate Partner Violence: Not on file    Review of Systems  PHYSICAL EXAMINATION:    LMP 01/12/1996 (Approximate)      General appearance: alert, cooperative and appears stated age Head: Normocephalic, without obvious abnormality, atraumatic Neck: no adenopathy, supple, symmetrical, trachea midline and thyroid normal to inspection and palpation Lungs: clear to auscultation bilaterally Breasts: normal appearance, no masses or tenderness, No nipple retraction or dimpling, No nipple discharge or bleeding, No axillary or supraclavicular adenopathy Heart: regular rate and rhythm Abdomen: soft, non-tender, no masses,  no organomegaly Extremities: extremities normal, atraumatic, no cyanosis or edema Skin: Skin color, texture, turgor normal. No rashes or lesions Lymph nodes: Cervical, supraclavicular, and axillary nodes normal. No abnormal inguinal nodes palpated Neurologic: Grossly normal  Pelvic: External genitalia:  no lesions              Urethra:  normal appearing urethra with no masses, tenderness or lesions              Bartholins and Skenes: normal                 Vagina: normal appearing vagina with normal color and discharge, no lesions              Cervix: no lesions                Bimanual Exam:  Uterus:  normal size, contour, position, consistency, mobility, non-tender              Adnexa: no mass, fullness, tenderness              Rectal exam: {yes no:314532}.  Confirms.              Anus:  normal sphincter tone, no lesions  Chaperone was present for exam:  ***  ASSESSMENT     PLAN     An After Visit Summary was printed and given to the patient.  ______ minutes face to face time of which over 50% was spent in counseling.

## 2021-10-15 ENCOUNTER — Ambulatory Visit: Payer: Medicare PPO | Admitting: Obstetrics and Gynecology

## 2021-10-19 NOTE — Progress Notes (Unsigned)
GYNECOLOGY  VISIT   HPI: 76 y.o.   Married  Caucasian  female   G0P0000 with Patient's last menstrual period was 01/12/1996 (approximate).   here to discuss dexa results from [facility] 10/02/21. T score of right radius -3.8 (was -3.3 in 2019).  T score of spine -1.4 (was -1.3 in 2019).  Hx fracture of left wrist.   Took Fosamax for over 5 years, and stopped this about 10 years ago. She did well with this.   No reflux.  No dental surgery planned.  No renal disease.   No tobacco use.   GYNECOLOGIC HISTORY: Patient's last menstrual period was 01/12/1996 (approximate). 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   Pain of left hip joint 03/31/2017   Osteoarthritis of left hip 03/31/2017   Closed fracture of left distal radius 07/20/2016   S/P right THA, AA 05/25/2016   Osteopenia 03/11/2015   Postmenopausal atrophic vaginitis 03/11/2015   SPRAIN AND STRAIN OF RIBS 01/09/2008   ANKLE INJURY, LEFT 01/09/2008   BREAST CANCER, HX OF 09/06/2006    Past Medical History:  Diagnosis Date   Arthritis    Atrophic vaginitis    Breast cancer ([place])    Broken wrist    left   Hyperlipidemia    Osteoporosis 10/02/2021   T score of radius:  -3.8   Vitamin D deficiency     Past Surgical History:  Procedure Laterality Date   BREAST BIOPSY Left 2000   BREAST CYST ASPIRATION Left 1995   BREAST LUMPECTOMY Left 07/16/2003   CATARACT EXTRACTION, BILATERAL  03/2014, 04/2014   PILONIDAL CYST EXCISION  age 43   SENTINEL LYMPH NODE BIOPSY Left 07/16/03   TONSILLECTOMY  as child   TOTAL HIP ARTHROPLASTY Right 05/25/2016    Procedure: RIGHT TOTAL HIP ARTHROPLASTY ANTERIOR APPROACH;  Surgeon: Paralee Cancel, MD;  Location: [facility];  Service: Orthopedics;  Laterality: Right;   TOTAL HIP ARTHROPLASTY Left 04/12/2017   Procedure: LEFT TOTAL HIP ARTHROPLASTY ANTERIOR APPROACH;  Surgeon: Paralee Cancel, MD;  Location: [facility];  Service: Orthopedics;  Laterality: Left;  70 mins   WISDOM TOOTH EXTRACTION  in college    Current Outpatient Medications  Medication Sig Dispense Refill   calcium-vitamin D (OSCAL WITH D) 500-200 MG-UNIT per tablet Take 1 tablet by mouth daily. (Patient taking differently: Take 1 tablet by mouth 2 (two) times daily.) 30 tablet 3   Multiple Vitamin (MULTIVITAMIN) tablet Take 1 tablet by mouth daily.     rosuvastatin (CRESTOR) 5 MG tablet Take 5 mg by mouth daily.     No current facility-administered medications for this visit.     ALLERGIES: Patient has no known allergies.  Family History  Problem Relation Age of Onset   Lung cancer Father 80   Heart disease  Mother    Breast cancer Sister 68   Cervical cancer Paternal Aunt    Stomach cancer Maternal Grandmother     Social History   Socioeconomic History   Marital status: Married    Spouse name: Not on file   Number of children: 0   Years of education: Not on file   Highest education level: Not on file  Occupational History   Not on file  Tobacco Use   Smoking status: Never   Smokeless tobacco: Never  Vaping Use   Vaping Use: Never used  Substance and Sexual Activity   Alcohol use: Yes    Alcohol/week: 7.0 standard drinks of alcohol    Types: 7 Glasses of wine per week   Drug use: No   Sexual activity: Not Currently    Partners: Male    Birth control/protection: Post-menopausal    Comment: first intercourse >17, less than 5 partners  Other Topics Concern   Not on file  Social History Narrative   Not on file   Social Determinants of Health   Financial Resource Strain: Not on file  Food Insecurity: Not on file   Transportation Needs: Not on file  Physical Activity: Not on file  Stress: Not on file  Social Connections: Not on file  Intimate Partner Violence: Not on file    Review of Systems  All other systems reviewed and are negative.   PHYSICAL EXAMINATION:    BP 104/62 (BP Location: Right Arm, Patient Position: Sitting)   Pulse 80   Resp 18   LMP 01/12/1996 (Approximate)   SpO2 90%     General appearance: alert, cooperative and appears stated age   ASSESSMENT  Osteoporosis of the wrist.  Hx prior wrist fracture.  Hx breast cancer.   PLAN  Bone density report discussed and increased risk of fracture reviewed. Counseled regarding osteoporosis, risk factors, treatment options (bisphosphonates, Prolia) and fall risk reduction. Discussed importance of weight bearing exercise and daily calcium 1200 mg daily and vit D.  (Currently on Vit D 50,000 IU weekly.) Will check TSH, PTH, phosphorus, vit D level, metabolic profile. If labs are normal, anticipate Fosamax 70 mg po weekly.  Next BMD in 2 years. Anticipate return for a recheck in April, 2024.    An After Visit Summary was printed and given to the patient.  25 min  total time was spent for this patient encounter, including preparation, face-to-face counseling with the patient, coordination of care, and documentation of the encounter.

## 2021-10-26 ENCOUNTER — Ambulatory Visit: Payer: Medicare PPO | Admitting: Obstetrics and Gynecology

## 2021-10-26 VITALS — BP 104/62 | HR 80 | Resp 18

## 2021-10-26 DIAGNOSIS — R739 Hyperglycemia, unspecified: Secondary | ICD-10-CM

## 2021-10-26 DIAGNOSIS — E875 Hyperkalemia: Secondary | ICD-10-CM

## 2021-10-26 DIAGNOSIS — M810 Age-related osteoporosis without current pathological fracture: Secondary | ICD-10-CM | POA: Diagnosis not present

## 2021-10-26 DIAGNOSIS — E785 Hyperlipidemia, unspecified: Secondary | ICD-10-CM | POA: Diagnosis not present

## 2021-10-26 DIAGNOSIS — E559 Vitamin D deficiency, unspecified: Secondary | ICD-10-CM | POA: Diagnosis not present

## 2021-10-26 NOTE — Patient Instructions (Signed)
Alendronate Tablets What is this medication? ALENDRONATE (a LEN droe nate) prevents and treats osteoporosis. It may also be used to treat Paget disease of the bone. It works by making your bones stronger and less likely to break (fracture). It belongs to a group of medications called bisphosphonates. This medicine may be used for other purposes; ask your health care provider or pharmacist if you have questions. COMMON BRAND NAME(S): Fosamax What should I tell my care team before I take this medication? They need to know if you have any of these conditions: Bleeding disorder Cancer Dental disease Difficulty swallowing Infection (fever, chills, cough, sore throat, pain or trouble passing urine) Kidney disease Low levels of calcium or other minerals in the blood Low red blood cell counts Receiving steroids like dexamethasone or prednisone Stomach or intestine problems Trouble sitting or standing for 30 minutes An unusual or allergic reaction to alendronate, other medications, foods, dyes or preservatives Pregnant or trying to get pregnant Breast-feeding How should I use this medication? Take this medication by mouth with a full glass of water. Take it as directed on the prescription label at the same time every day. Take the dose right after waking up. Do not eat or drink anything before taking it. Do not take it with any other drink except water. Do not chew or crush the tablet. After taking it, do not eat breakfast, drink, or take any other medications or vitamins for at least 30 minutes. Sit or stand up for at least 30 minutes after you take it. Do not lie down. Keep taking it unless your care team tells you to stop. A special MedGuide will be given to you by the pharmacist with each prescription and refill. Be sure to read this information carefully each time. Talk to your care team about the use of this medication in children. Special care may be needed. Overdosage: If you think you have  taken too much of this medicine contact a poison control center or emergency room at once. NOTE: This medicine is only for you. Do not share this medicine with others. What if I miss a dose? If you take your medication once a day, skip it. Take your next dose at the scheduled time the next morning. Do not take two doses on the same day. If you take your medication once a week, take the missed dose on the morning after you remember. Do not take two doses on the same day. What may interact with this medication? Aluminum hydroxide Antacids Aspirin Calcium supplements Medications for inflammation like ibuprofen, naproxen, and others Iron supplements Magnesium supplements Vitamins with minerals This list may not describe all possible interactions. Give your health care provider a list of all the medicines, herbs, non-prescription drugs, or dietary supplements you use. Also tell them if you smoke, drink alcohol, or use illegal drugs. Some items may interact with your medicine. What should I watch for while using this medication? Visit your care team for regular checks on your progress. It may be some time before you see the benefit from this medication. Some people who take this medication have severe bone, joint, or muscle pain. This medication may also increase your risk for jaw problems or a broken thigh bone. Tell your care team right away if you have severe pain in your jaw, bones, joints, or muscles. Tell you care team if you have any pain that does not go away or that gets worse. Tell your dentist and dental surgeon that you are   taking this medication. You should not have major dental surgery while on this medication. See your dentist to have a dental exam and fix any dental problems before starting this medication. Take good care of your teeth while on this medication. Make sure you see your dentist for regular follow-up appointments. You should make sure you get enough calcium and vitamin D  while you are taking this medication. Discuss the foods you eat and the vitamins you take with your care team. You may need blood work done while you are taking this medication. What side effects may I notice from receiving this medication? Side effects that you should report to your care team as soon as possible: Allergic reactions--skin rash, itching, hives, swelling of the face, lips, tongue, or throat Low calcium level--muscle pain or cramps, confusion, tingling, or numbness in the hands or feet Osteonecrosis of the jaw--pain, swelling, or redness in the mouth, numbness of the jaw, poor healing after dental work, unusual discharge from the mouth, visible bones in the mouth Pain or trouble swallowing Severe bone, joint, or muscle pain Stomach bleeding--bloody or black, tar-like stools, vomiting blood or brown material that looks like coffee grounds Side effects that usually do not require medical attention (report to your care team if they continue or are bothersome): Constipation Diarrhea Nausea Stomach pain This list may not describe all possible side effects. Call your doctor for medical advice about side effects. You may report side effects to FDA at 1-800-FDA-1088. Where should I keep my medication? Keep out of the reach of children and pets. Store at room temperature between 15 and 30 degrees C (59 and 86 degrees F). Throw away any unused medication after the expiration date. NOTE: This sheet is a summary. It may not cover all possible information. If you have questions about this medicine, talk to your doctor, pharmacist, or health care provider.  2023 Elsevier/Gold Standard (2007-02-18 00:00:00)

## 2021-10-27 LAB — BASIC METABOLIC PANEL
BUN: 22 mg/dL (ref 7–25)
CO2: 26 mmol/L (ref 20–32)
Calcium: 10 mg/dL (ref 8.6–10.4)
Chloride: 103 mmol/L (ref 98–110)
Creat: 0.78 mg/dL (ref 0.60–1.00)
Glucose, Bld: 112 mg/dL — ABNORMAL HIGH (ref 65–99)
Potassium: 5.5 mmol/L — ABNORMAL HIGH (ref 3.5–5.3)
Sodium: 139 mmol/L (ref 135–146)

## 2021-10-27 LAB — VITAMIN D 25 HYDROXY (VIT D DEFICIENCY, FRACTURES): Vit D, 25-Hydroxy: 55 ng/mL (ref 30–100)

## 2021-10-27 LAB — PHOSPHORUS: Phosphorus: 3.3 mg/dL (ref 2.1–4.3)

## 2021-10-27 LAB — PARATHYROID HORMONE, INTACT (NO CA): PTH: 25 pg/mL (ref 16–77)

## 2021-10-27 LAB — TSH: TSH: 1.83 mIU/L (ref 0.40–4.50)

## 2021-10-28 ENCOUNTER — Encounter: Payer: Self-pay | Admitting: Obstetrics and Gynecology

## 2021-10-29 ENCOUNTER — Other Ambulatory Visit: Payer: Self-pay | Admitting: *Deleted

## 2021-10-29 MED ORDER — ALENDRONATE SODIUM 70 MG PO TABS: 70.0000 mg | ORAL_TABLET | ORAL | 3 refills | Status: DC

## 2021-10-29 NOTE — Addendum Note (Signed)
Addended by: Yisroel Ramming, Dietrich Pates E on: 10/29/2021 06:18 AM   Modules accepted: Orders

## 2021-11-02 ENCOUNTER — Other Ambulatory Visit: Payer: Medicare PPO

## 2021-11-02 ENCOUNTER — Other Ambulatory Visit: Payer: Self-pay | Admitting: *Deleted

## 2021-11-02 DIAGNOSIS — R739 Hyperglycemia, unspecified: Secondary | ICD-10-CM | POA: Diagnosis not present

## 2021-11-02 DIAGNOSIS — E875 Hyperkalemia: Secondary | ICD-10-CM | POA: Diagnosis not present

## 2021-11-03 LAB — HEMOGLOBIN A1C
Hgb A1c MFr Bld: 5.4 % of total Hgb (ref <5.7)
Mean Plasma Glucose: 108 mg/dL
eAG (mmol/L): 6 mmol/L

## 2021-11-03 LAB — BASIC METABOLIC PANEL
BUN: 17 mg/dL (ref 7–25)
CO2: 26 mmol/L (ref 20–32)
Calcium: 9.9 mg/dL (ref 8.6–10.4)
Chloride: 101 mmol/L (ref 98–110)
Creat: 0.72 mg/dL (ref 0.60–1.00)
Glucose, Bld: 110 mg/dL — ABNORMAL HIGH (ref 65–99)
Potassium: 4.2 mmol/L (ref 3.5–5.3)
Sodium: 137 mmol/L (ref 135–146)

## 2021-12-21 DIAGNOSIS — Z1159 Encounter for screening for other viral diseases: Secondary | ICD-10-CM | POA: Diagnosis not present

## 2021-12-21 DIAGNOSIS — E7800 Pure hypercholesterolemia, unspecified: Secondary | ICD-10-CM | POA: Diagnosis not present

## 2021-12-21 DIAGNOSIS — Z0000 Encounter for general adult medical examination without abnormal findings: Secondary | ICD-10-CM | POA: Diagnosis not present

## 2021-12-21 DIAGNOSIS — Z23 Encounter for immunization: Secondary | ICD-10-CM | POA: Diagnosis not present

## 2021-12-21 DIAGNOSIS — M810 Age-related osteoporosis without current pathological fracture: Secondary | ICD-10-CM | POA: Diagnosis not present

## 2022-03-18 ENCOUNTER — Encounter: Payer: Self-pay | Admitting: Obstetrics and Gynecology

## 2022-03-18 ENCOUNTER — Other Ambulatory Visit: Payer: Self-pay | Admitting: Obstetrics and Gynecology

## 2022-03-18 MED ORDER — ALENDRONATE SODIUM 70 MG PO TABS: 70.0000 mg | ORAL_TABLET | ORAL | 0 refills | Status: DC

## 2022-03-18 NOTE — Progress Notes (Signed)
Refill of Fosamax for 3 months.

## 2022-04-21 ENCOUNTER — Ambulatory Visit: Payer: Medicare PPO | Admitting: Obstetrics and Gynecology

## 2022-08-09 ENCOUNTER — Ambulatory Visit: Payer: Medicare PPO | Admitting: Obstetrics and Gynecology

## 2022-08-10 NOTE — Progress Notes (Signed)
77 y.o. G0P0000 Married Caucasian female here for recheck appointment.    She has osteoporosis.  Last BMD was done 10/02/21 at [facility].  T score of right radius -3.8 (was -3.3 in 2019).  T score of spine -1.4 (was -1.3 in 2019).  She used Fosamax for 5 years, stopped, and then restarted at after the last BMD was done in the Fall, 2023.   Taking calcium daily.  Goes to [place] for the winter.   PCP:   Dr. Sigmund Hazel  Patient's last menstrual period was 01/12/1996 (approximate).           Sexually active: No.  The current method of family planning is post menopausal status.    Exercising: Yes.     Walking everyday, weights Smoker:  no  Health Maintenance: Pap:  08/04/21 neg: HR HPV neg, 06/28/19 neg History of abnormal Pap:  no MMG:  10/05/21 Breast Density Cat C, BI-RADS CAT 2 benign Colonoscopy:  07/17/18 BMD:   10/06/21  Result  osteoporosis TDaP:  PCP Gardasil:   no HIV: n/a Hep C: 03/11/15 Neg Screening Labs:  PCP   reports that she has never smoked. She has never used smokeless tobacco. She reports current alcohol use of about 7.0 standard drinks of alcohol per week. She reports that she does not use drugs.  Past Medical History:  Diagnosis Date   Arthritis    Atrophic vaginitis    Breast cancer ([facility])    Broken wrist    left   Hyperlipidemia    Osteoporosis 10/02/2021   T score of radius:  -3.8   Vitamin D deficiency     Past Surgical History:  Procedure Laterality Date   BREAST BIOPSY Left 2000   BREAST CYST ASPIRATION Left 1995   BREAST LUMPECTOMY Left 07/16/2003   CATARACT EXTRACTION, BILATERAL  03/2014, 04/2014   PILONIDAL CYST EXCISION  age 53   SENTINEL LYMPH NODE BIOPSY Left 07/16/03   TONSILLECTOMY  as child   TOTAL HIP ARTHROPLASTY Right 05/25/2016   Procedure: RIGHT TOTAL HIP ARTHROPLASTY ANTERIOR APPROACH;  Surgeon: Durene Romans, MD;  Location: [facility];  Service: Orthopedics;  Laterality: Right;   TOTAL HIP ARTHROPLASTY Left 04/12/2017   Procedure: LEFT  TOTAL HIP ARTHROPLASTY ANTERIOR APPROACH;  Surgeon: Durene Romans, MD;  Location: [facility];  Service: Orthopedics;  Laterality: Left;  70 mins   WISDOM TOOTH EXTRACTION  in college    Current Outpatient Medications  Medication Sig Dispense Refill   alendronate (FOSAMAX) 70 MG tablet Take 1 tablet (70 mg total) by mouth every 7 (seven) days. Take with a full glass of water on an empty stomach. 12 tablet 0   calcium-vitamin D (OSCAL WITH D) 500-200 MG-UNIT per tablet Take 1 tablet by mouth daily. (Patient taking differently: Take 1 tablet by mouth 2 (two) times daily.) 30 tablet 3   Multiple Vitamin (MULTIVITAMIN) tablet Take 1 tablet by mouth daily.     rosuvastatin (CRESTOR) 5 MG tablet Take 5 mg by mouth daily.     No current facility-administered medications for this visit.    Family History  Problem Relation Age of Onset   Lung cancer Father 77   Heart disease Mother    Breast cancer Sister 63   Cervical cancer Paternal Aunt    Stomach cancer Maternal Grandmother     Review of Systems  All other systems reviewed and are negative.   Exam:   BP 124/82 (BP Location: Left Arm, Patient Position: Sitting, Cuff Size: Normal)  Pulse 98   Ht 5' 6.5" (1.689 m)   Wt 119 lb (54 kg)   LMP 01/12/1996 (Approximate)   SpO2 97%   BMI 18.92 kg/m     General appearance: alert, cooperative and appears stated age Head: normocephalic, without obvious abnormality, atraumatic Neck: no adenopathy, supple, symmetrical, trachea midline and thyroid normal to inspection and palpation Lungs: clear to auscultation bilaterally Heart: regular rate and rhythm Abdomen: soft, non-tender; no masses, no organomegaly Inguinal nodes:  normal.  Assessment:     Osteoporosis without current fracture.   Plan:  Continue Fosamax 70 mg daily.  #12, RF 3.  We reviewed potential increased risk of atypical hip fracture on prolonged Fosamax.  Maximum use of 10 years.  Continue calcium 1200 mg daily in divided  doses.  Vit D at least 600 - 800 international units daily.  Continue weight bearing exercise.  Fall risk reduction reviewed.  Next BMD in 1 year.  23 min  total time was spent for this patient encounter, including preparation, face-to-face counseling with the patient, coordination of care, and documentation of the encounter.

## 2022-08-24 ENCOUNTER — Ambulatory Visit (INDEPENDENT_AMBULATORY_CARE_PROVIDER_SITE_OTHER): Payer: Medicare PPO | Admitting: Obstetrics and Gynecology

## 2022-08-24 ENCOUNTER — Encounter: Payer: Self-pay | Admitting: Obstetrics and Gynecology

## 2022-08-24 VITALS — BP 124/82 | HR 98 | Ht 66.5 in | Wt 119.0 lb

## 2022-08-24 DIAGNOSIS — M810 Age-related osteoporosis without current pathological fracture: Secondary | ICD-10-CM

## 2022-08-24 DIAGNOSIS — Z5181 Encounter for therapeutic drug level monitoring: Secondary | ICD-10-CM

## 2022-08-24 MED ORDER — ALENDRONATE SODIUM 70 MG PO TABS: 70.0000 mg | ORAL_TABLET | ORAL | 3 refills | Status: DC

## 2022-10-13 DIAGNOSIS — Z961 Presence of intraocular lens: Secondary | ICD-10-CM | POA: Diagnosis not present

## 2022-10-13 DIAGNOSIS — H5213 Myopia, bilateral: Secondary | ICD-10-CM | POA: Diagnosis not present

## 2022-10-15 DIAGNOSIS — Z1231 Encounter for screening mammogram for malignant neoplasm of breast: Secondary | ICD-10-CM | POA: Diagnosis not present

## 2022-10-20 ENCOUNTER — Encounter: Payer: Self-pay | Admitting: Obstetrics and Gynecology

## 2022-12-20 DIAGNOSIS — E7800 Pure hypercholesterolemia, unspecified: Secondary | ICD-10-CM | POA: Diagnosis not present

## 2022-12-20 DIAGNOSIS — Z79899 Other long term (current) drug therapy: Secondary | ICD-10-CM | POA: Diagnosis not present

## 2022-12-24 DIAGNOSIS — M810 Age-related osteoporosis without current pathological fracture: Secondary | ICD-10-CM | POA: Diagnosis not present

## 2022-12-24 DIAGNOSIS — E7800 Pure hypercholesterolemia, unspecified: Secondary | ICD-10-CM | POA: Diagnosis not present

## 2022-12-24 DIAGNOSIS — Z0000 Encounter for general adult medical examination without abnormal findings: Secondary | ICD-10-CM | POA: Diagnosis not present

## 2022-12-24 DIAGNOSIS — Z681 Body mass index (BMI) 19 or less, adult: Secondary | ICD-10-CM | POA: Diagnosis not present

## 2022-12-24 DIAGNOSIS — Z853 Personal history of malignant neoplasm of breast: Secondary | ICD-10-CM | POA: Diagnosis not present

## 2022-12-24 DIAGNOSIS — L309 Dermatitis, unspecified: Secondary | ICD-10-CM | POA: Diagnosis not present

## 2023-08-03 DIAGNOSIS — H6122 Impacted cerumen, left ear: Secondary | ICD-10-CM | POA: Diagnosis not present

## 2023-08-03 DIAGNOSIS — H9202 Otalgia, left ear: Secondary | ICD-10-CM | POA: Diagnosis not present

## 2023-08-26 NOTE — Progress Notes (Unsigned)
 78 y.o. G44P0000 Married Caucasian female here for a breast and pelvic exam.    The patient is also followed for osteoporosis.   Last BMD was done 10/02/21 at [facility].  T score of right radius -3.8 (was -3.3 in 2019).  T score of spine -1.4 (was -1.3 in 2019).   She used Fosamax  for 5 years, stopped, and then restarted at after the last BMD was done in the Fall, 2023.  No problems with Fosamax .  Due for a BMD at [facility] in September.    No vaginal bleeding or discharge.  No bladder or bowel concerns.   PCP: Cleotilde Planas, MD   Patient's last menstrual period was 01/12/1996 (approximate).           Sexually active: No.  The current method of family planning is post menopausal status.    Menopausal hormone therapy:  n/a Exercising: yes  walking daily and weights 2 days a week.  Smoker:  no  OB History     Gravida  0   Para  0   Term  0   Preterm  0   AB  0   Living  0      SAB  0   IAB  0   Ectopic  0   Multiple  0   Live Births  0           HEALTH MAINTENANCE: Last 2 paps: 08/04/21 neg HR HPV neg, 06/28/19 neg  History of abnormal Pap or positive HPV:  no Mammogram:  10/15/22 Breast Density Cat C, BIRADS Cat 2 benign  Colonoscopy:  07/17/18 Bone Density:  10/06/21  Result  osteoporosis    Immunization History  Administered Date(s) Administered   Influenza Whole 11/18/2006, 10/19/2007, 11/25/2008, 10/01/2009   Influenza-Unspecified 10/23/2014   PFIZER(Purple Top)SARS-COV-2 Vaccination 03/19/2019, 04/09/2019, 10/18/2019   Pfizer Covid-19 Vaccine Bivalent Booster 39yrs & up 11/10/2020   Pneumococcal Conjugate-13 09/06/2013   Tdap 08/11/2009      reports that she has never smoked. She has never used smokeless tobacco. She reports current alcohol use of about 7.0 standard drinks of alcohol per week. She reports that she does not use drugs.  Past Medical History:  Diagnosis Date   Arthritis    Atrophic vaginitis    Breast cancer ([facility])    Broken wrist     left   Hyperlipidemia    Osteoporosis 10/02/2021   T score of radius:  -3.8   Vitamin D  deficiency     Past Surgical History:  Procedure Laterality Date   BREAST BIOPSY Left 2000   BREAST CYST ASPIRATION Left 1995   BREAST LUMPECTOMY Left 07/16/2003   CATARACT EXTRACTION, BILATERAL  03/2014, 04/2014   PILONIDAL CYST EXCISION  age 81   SENTINEL LYMPH NODE BIOPSY Left 07/16/03   TONSILLECTOMY  as child   TOTAL HIP ARTHROPLASTY Right 05/25/2016   Procedure: RIGHT TOTAL HIP ARTHROPLASTY ANTERIOR APPROACH;  Surgeon: Ernie Cough, MD;  Location: [facility];  Service: Orthopedics;  Laterality: Right;   TOTAL HIP ARTHROPLASTY Left 04/12/2017   Procedure: LEFT TOTAL HIP ARTHROPLASTY ANTERIOR APPROACH;  Surgeon: Ernie Cough, MD;  Location: [facility];  Service: Orthopedics;  Laterality: Left;  70 mins   WISDOM TOOTH EXTRACTION  in college    Current Outpatient Medications  Medication Sig Dispense Refill   alendronate  (FOSAMAX ) 70 MG tablet Take 1 tablet (70 mg total) by mouth every 7 (seven) days. Take with a full glass of water  on an empty stomach.  12 tablet 3   calcium -vitamin D  (OSCAL WITH D) 500-200 MG-UNIT per tablet Take 1 tablet by mouth daily. (Patient taking differently: Take 1 tablet by mouth 2 (two) times daily.) 30 tablet 3   Multiple Vitamin (MULTIVITAMIN) tablet Take 1 tablet by mouth daily.     rosuvastatin (CRESTOR) 5 MG tablet Take 5 mg by mouth daily.     No current facility-administered medications for this visit.    ALLERGIES: Patient has no known allergies.  Family History  Problem Relation Age of Onset   Lung cancer Father 27   Heart disease Mother    Breast cancer Sister 30   Cervical cancer Paternal Aunt    Stomach cancer Maternal Grandmother     Review of Systems  All other systems reviewed and are negative.   PHYSICAL EXAM:  BP 124/72 (BP Location: Left Arm, Patient Position: Sitting)   Pulse 60   Ht 5' 5.5 (1.664 m)   Wt 113 lb (51.3 kg)   LMP 01/12/1996  (Approximate)   SpO2 93%   BMI 18.52 kg/m     General appearance: alert, cooperative and appears stated age Head: normocephalic, without obvious abnormality, atraumatic Neck: no adenopathy, supple, symmetrical, trachea midline and thyroid  normal to inspection and palpation Lungs: clear to auscultation bilaterally Breasts: right - normal appearance, no masses or tenderness, No nipple retraction or dimpling, No nipple discharge or bleeding, No axillary adenopathy Left - radiation changes to skin and nipple retraction with scar present.  No masses, generalized tenderness, no axillary adenopathy Heart: regular rate and rhythm Abdomen: soft, non-tender; no masses, no organomegaly Extremities: extremities normal, atraumatic, no cyanosis or edema Skin: skin color, texture, turgor normal. No rashes or lesions Lymph nodes: cervical, supraclavicular, and axillary nodes normal. Neurologic: grossly normal  Pelvic: External genitalia:  no lesions.  Mild generalized erythema of the vulva.  No lesions. (Does not wear pads.)              No abnormal inguinal nodes palpated.              Urethra:  normal appearing urethra with no masses, tenderness or lesions              Bartholins and Skenes: normal                 Vagina: normal appearing vagina with normal color and discharge, no lesions              Cervix: no lesions              Pap taken: no Bimanual Exam:  Uterus:  normal size, contour, position, consistency, mobility, non-tender              Adnexa: no mass, fullness, tenderness              Rectal exam: yes.  Confirms.              Anus:  normal sphincter tone, no lesions  Chaperone was present for exam:  Kari HERO, CMA  ASSESSMENT: Encounter for breast and pelvic exam.  Osteoporosis.  On Fosamax .  Hx wrist fracture.  Personal history of left breast cancer.   PLAN: Mammogram screening discussed. Self breast awareness reviewed. Pap and HRV collected:  no.  Due in 2028.  Guidelines for  Calcium , Vitamin D , regular exercise program including cardiovascular and weight bearing exercise. Medication refills:  Fosamax  70 mg weekly.  #12, RF 3.  Check BMD and Vit D levels.  BMD at [facility].  Will fax order.  Follow up:  1 year for office visit.     Additional counseling given.  yes. 20 min  total time was spent for this patient encounter, including preparation, face-to-face counseling with the patient, coordination of care, and documentation of the encounter in addition to doing the breast and pelvic exam.

## 2023-08-30 ENCOUNTER — Ambulatory Visit (INDEPENDENT_AMBULATORY_CARE_PROVIDER_SITE_OTHER): Payer: Medicare PPO | Admitting: Obstetrics and Gynecology

## 2023-08-30 ENCOUNTER — Encounter: Payer: Self-pay | Admitting: Obstetrics and Gynecology

## 2023-08-30 VITALS — BP 124/72 | HR 60 | Ht 65.5 in | Wt 113.0 lb

## 2023-08-30 DIAGNOSIS — Z01419 Encounter for gynecological examination (general) (routine) without abnormal findings: Secondary | ICD-10-CM | POA: Diagnosis not present

## 2023-08-30 DIAGNOSIS — M810 Age-related osteoporosis without current pathological fracture: Secondary | ICD-10-CM

## 2023-08-30 DIAGNOSIS — Z5181 Encounter for therapeutic drug level monitoring: Secondary | ICD-10-CM | POA: Diagnosis not present

## 2023-08-30 MED ORDER — ALENDRONATE SODIUM 70 MG PO TABS: 70.0000 mg | ORAL_TABLET | ORAL | 3 refills | Status: AC

## 2023-08-30 NOTE — Patient Instructions (Signed)

## 2023-08-31 ENCOUNTER — Ambulatory Visit: Payer: Self-pay | Admitting: Obstetrics and Gynecology

## 2023-08-31 LAB — BASIC METABOLIC PANEL WITH GFR
BUN: 17 mg/dL (ref 7–25)
CO2: 25 mmol/L (ref 20–32)
Calcium: 9.7 mg/dL (ref 8.6–10.4)
Chloride: 102 mmol/L (ref 98–110)
Creat: 0.91 mg/dL (ref 0.60–1.00)
Glucose, Bld: 111 mg/dL — ABNORMAL HIGH (ref 65–99)
Potassium: 4.1 mmol/L (ref 3.5–5.3)
Sodium: 139 mmol/L (ref 135–146)
eGFR: 65 mL/min/1.73m2 (ref >=60)

## 2023-08-31 LAB — VITAMIN D 25 HYDROXY (VIT D DEFICIENCY, FRACTURES): Vit D, 25-Hydroxy: 66 ng/mL (ref 30–100)

## 2023-09-02 NOTE — Telephone Encounter (Signed)
 08/30/23 labs faxed to PCP via EPIC requesting A1c with 12/20/23 labs.

## 2023-10-18 DIAGNOSIS — Z961 Presence of intraocular lens: Secondary | ICD-10-CM | POA: Diagnosis not present

## 2023-10-18 DIAGNOSIS — H5213 Myopia, bilateral: Secondary | ICD-10-CM | POA: Diagnosis not present

## 2023-10-21 DIAGNOSIS — Z1231 Encounter for screening mammogram for malignant neoplasm of breast: Secondary | ICD-10-CM | POA: Diagnosis not present

## 2023-10-21 DIAGNOSIS — M810 Age-related osteoporosis without current pathological fracture: Secondary | ICD-10-CM | POA: Diagnosis not present

## 2023-10-21 LAB — HM DEXA SCAN

## 2023-10-21 LAB — HM MAMMOGRAPHY

## 2023-10-24 ENCOUNTER — Encounter: Payer: Self-pay | Admitting: Obstetrics and Gynecology

## 2023-10-24 ENCOUNTER — Ambulatory Visit: Payer: Self-pay | Admitting: Obstetrics and Gynecology

## 2023-12-06 ENCOUNTER — Encounter: Payer: Self-pay | Admitting: Obstetrics and Gynecology

## 2023-12-06 ENCOUNTER — Telehealth: Payer: Self-pay | Admitting: Obstetrics and Gynecology

## 2023-12-06 ENCOUNTER — Ambulatory Visit: Admitting: Obstetrics and Gynecology

## 2023-12-06 VITALS — BP 118/82 | HR 80

## 2023-12-06 DIAGNOSIS — M810 Age-related osteoporosis without current pathological fracture: Secondary | ICD-10-CM

## 2023-12-06 NOTE — Progress Notes (Signed)
 GYNECOLOGY  VISIT   HPI: 78 y.o.   Married  Caucasian female   [id number] with Patient's last menstrual period was 01/12/1996 (approximate).   here for: Dexa Consult      Dexa 10/21/23 at [facility] showed T score right radius -3.8, spine -1.4.  No improvement of her forearm since last Dexa.   Has had bilateral arthroplasties.  Hx breast cancer and anastrazole tx.  Hx prior fracture of left wrist.   Used Fosamax  for 5 years, stopped and then restarted after BMD done in 2023.   Husband just started Prolia.    Patient will be away from [facility] for the winter.  Hoping to receive a Prolia injection in December.   GYNECOLOGIC HISTORY: Patient's last menstrual period was 01/12/1996 (approximate). Contraception:  PMP Menopausal hormone therapy:  n/a Last 2 paps:   08/04/21 neg HR HPV neg, 06/28/19 neg  History of abnormal Pap or positive HPV:  no Mammogram:  10/21/23 Breast Density Cat C, BIRADS Cat 1 neg         OB History     Gravida  0   Para  0   Term  0   Preterm  0   AB  0   Living  0      SAB  0   IAB  0   Ectopic  0   Multiple  0   Live Births  0              Patient Active Problem List   Diagnosis Date Noted   S/P left THA, AA 04/12/2017   Pain of left hip joint 03/31/2017   Osteoarthritis of left hip 03/31/2017   Closed fracture of left distal radius 07/20/2016   S/P right THA, AA 05/25/2016   Osteopenia 03/11/2015   Postmenopausal atrophic vaginitis 03/11/2015   SPRAIN AND STRAIN OF RIBS 01/09/2008   ANKLE INJURY, LEFT 01/09/2008   BREAST CANCER, HX OF 09/06/2006    Past Medical History:  Diagnosis Date   Arthritis    Atrophic vaginitis    Breast cancer (HCC)    Broken wrist    left   Hyperlipidemia    Osteoporosis 10/02/2021   T score of radius:  -3.8   Vitamin D  deficiency     Past Surgical History:  Procedure Laterality Date   BREAST BIOPSY Left 2000   BREAST CYST ASPIRATION Left 1995   BREAST LUMPECTOMY Left 07/16/2003    CATARACT EXTRACTION, BILATERAL  03/2014, 04/2014   PILONIDAL CYST EXCISION  age 18   SENTINEL LYMPH NODE BIOPSY Left 07/16/03   TONSILLECTOMY  as child   TOTAL HIP ARTHROPLASTY Right 05/25/2016   Procedure: RIGHT TOTAL HIP ARTHROPLASTY ANTERIOR APPROACH;  Surgeon: Ernie Cough, MD;  Location: [facility];  Service: Orthopedics;  Laterality: Right;   TOTAL HIP ARTHROPLASTY Left 04/12/2017   Procedure: LEFT TOTAL HIP ARTHROPLASTY ANTERIOR APPROACH;  Surgeon: Ernie Cough, MD;  Location: [facility];  Service: Orthopedics;  Laterality: Left;  70 mins   WISDOM TOOTH EXTRACTION  in college    Current Outpatient Medications  Medication Sig Dispense Refill   alendronate  (FOSAMAX ) 70 MG tablet Take 1 tablet (70 mg total) by mouth every 7 (seven) days. Take with a full glass of water  on an empty stomach. 12 tablet 3   calcium -vitamin D  (OSCAL WITH D) 500-200 MG-UNIT per tablet Take 1 tablet by mouth daily. (Patient taking differently: Take 1 tablet by mouth 2 (two) times daily.) 30 tablet 3  Multiple Vitamin (MULTIVITAMIN) tablet Take 1 tablet by mouth daily.     rosuvastatin (CRESTOR) 5 MG tablet Take 5 mg by mouth daily.     No current facility-administered medications for this visit.     ALLERGIES: Patient has no known allergies.  Family History  Problem Relation Age of Onset   Lung cancer Father 64   Heart disease Mother    Breast cancer Sister 3   Cervical cancer Paternal Aunt    Stomach cancer Maternal Grandmother     Social History   Socioeconomic History   Marital status: Married    Spouse name: Not on file   Number of children: 0   Years of education: Not on file   Highest education level: Not on file  Occupational History   Not on file  Tobacco Use   Smoking status: Never   Smokeless tobacco: Never  Vaping Use   Vaping status: Never Used  Substance and Sexual Activity   Alcohol use: Yes    Alcohol/week: 7.0 standard drinks of alcohol    Types: 7 Glasses of wine per week   Drug  use: No   Sexual activity: Not Currently    Partners: Male    Birth control/protection: Post-menopausal    Comment: After 17, less than 5, no abnormal pap, no std, no DES, hx of breast cancer - 20 years ago.  Other Topics Concern   Not on file  Social History Narrative   Not on file   Social Drivers of Health   Financial Resource Strain: Not on file  Food Insecurity: Not on file  Transportation Needs: Not on file  Physical Activity: Not on file  Stress: Not on file  Social Connections: Not on file  Intimate Partner Violence: Not on file    Review of Systems  All other systems reviewed and are negative.   PHYSICAL EXAMINATION:   BP 118/82 (BP Location: Left Arm, Patient Position: Sitting)   Pulse 80   LMP 01/12/1996 (Approximate)   SpO2 99%     General appearance: alert, cooperative and appears stated age   ASSESSMENT:  Osteoporosis, not improved with Fosamax .  Hx adult fracture.   PLAN:  We discussed fall risk reduction.  Continue weight bearing exercise.  She is on calcium  and vit D.  Continue Fosamax  for now.  Will precert Prolia.  Risks and benefits discussed with patient.  Risks of osteonecrosis of jaw, bone/joint pain, low calcium , increased risk of infection, and sometimes difficulty discontinuation of medication without substituting another osteoporosis treatment.   25 min  total time was spent for this patient encounter, including preparation, face-to-face counseling with the patient, coordination of care, and documentation of the encounter.

## 2023-12-06 NOTE — Patient Instructions (Signed)
 Denosumab Injection (Osteoporosis) What is this medication? DENOSUMAB (den oh SUE mab) prevents and treats osteoporosis. It works by Interior and spatial designer stronger and less likely to break (fracture). It is a monoclonal antibody. This medicine may be used for other purposes; ask your health care provider or pharmacist if you have questions. COMMON BRAND NAME(S): Prolia What should I tell my care team before I take this medication? They need to know if you have any of these conditions: Dental or gum disease Had thyroid or parathyroid (glands located in neck) surgery Having dental surgery or a tooth pulled Kidney disease Low levels of calcium in the blood On dialysis Poor nutrition Thyroid disease Trouble absorbing nutrients from your food An unusual or allergic reaction to denosumab, other medications, foods, dyes, or preservatives Pregnant or trying to get pregnant Breastfeeding How should I use this medication? This medication is injected under the skin. It is given by your care team in a hospital or clinic setting. A special MedGuide will be given to you before each treatment. Be sure to read this information carefully each time. Talk to your care team about the use of this medication in children. Special care may be needed. Overdosage: If you think you have taken too much of this medicine contact a poison control center or emergency room at once. NOTE: This medicine is only for you. Do not share this medicine with others. What if I miss a dose? Keep appointments for follow-up doses. It is important not to miss your dose. Call your care team if you are unable to keep an appointment. What may interact with this medication? Do not take this medication with any of the following: Other medications that contain denosumab This medication may also interact with the following: Medications that lower your chance of fighting infection Steroid medications, such as prednisone or cortisone This  list may not describe all possible interactions. Give your health care provider a list of all the medicines, herbs, non-prescription drugs, or dietary supplements you use. Also tell them if you smoke, drink alcohol, or use illegal drugs. Some items may interact with your medicine. What should I watch for while using this medication? Your condition will be monitored carefully while you are receiving this medication. You may need blood work done while taking this medication. This medication may increase your risk of getting an infection. Call your care team for advice if you get a fever, chills, sore throat, or other symptoms of a cold or flu. Do not treat yourself. Try to avoid being around people who are sick. Tell your dentist and dental surgeon that you are taking this medication. You should not have major dental surgery while on this medication. See your dentist to have a dental exam and fix any dental problems before starting this medication. Take good care of your teeth while on this medication. Make sure you see your dentist for regular follow-up appointments. This medication may cause low levels of calcium in your body. The risk of severe side effects is increased in people with kidney disease. Your care team may prescribe calcium and vitamin D to help prevent low calcium levels while you take this medication. It is important to take calcium and vitamin D as directed by your care team. Talk to your care team if you may be pregnant. Serious birth defects may occur if you take this medication during pregnancy and for 5 months after the last dose. You will need a negative pregnancy test before starting this medication. Contraception  is recommended while taking this medication and for 5 months after the last dose. Your care team can help you find the option that works for you. Talk to your care team before breastfeeding. Changes to your treatment plan may be needed. What side effects may I notice from  receiving this medication? Side effects that you should report to your care team as soon as possible: Allergic reactions--skin rash, itching, hives, swelling of the face, lips, tongue, or throat Infection--fever, chills, cough, sore throat, wounds that don't heal, pain or trouble when passing urine, general feeling of discomfort or being unwell Low calcium level--muscle pain or cramps, confusion, tingling, or numbness in the hands or feet Osteonecrosis of the jaw--pain, swelling, or redness in the mouth, numbness of the jaw, poor healing after dental work, unusual discharge from the mouth, visible bones in the mouth Severe bone, joint, or muscle pain Skin infection--skin redness, swelling, warmth, or pain Side effects that usually do not require medical attention (report these to your care team if they continue or are bothersome): Back pain Headache Joint pain Muscle pain Pain in the hands, arms, legs, or feet Runny or stuffy nose Sore throat This list may not describe all possible side effects. Call your doctor for medical advice about side effects. You may report side effects to FDA at 1-800-FDA-1088. Where should I keep my medication? This medication is given in a hospital or clinic. It will not be stored at home. NOTE: This sheet is a summary. It may not cover all possible information. If you have questions about this medicine, talk to your doctor, pharmacist, or health care provider.  2024 Elsevier/Gold Standard (2022-02-02 00:00:00)

## 2023-12-06 NOTE — Telephone Encounter (Signed)
 Please initiate approval for Prolia.  Patient has osteoporosis of her forearm, bone density showing no improvement with Fosamax , and history of adult fracture of her forearm.

## 2023-12-14 MED ORDER — DENOSUMAB 60 MG/ML ~~LOC~~ SOSY
60.0000 mg | PREFILLED_SYRINGE | Freq: Once | SUBCUTANEOUS | Status: AC
  Administered 2023-12-28: 15:00:00 60 mg via SUBCUTANEOUS

## 2023-12-14 NOTE — Telephone Encounter (Signed)
 CAM order for Prolia placed for benefits verification.  Encounter closed.

## 2023-12-15 ENCOUNTER — Telehealth: Payer: Self-pay

## 2023-12-15 DIAGNOSIS — M810 Age-related osteoporosis without current pathological fracture: Secondary | ICD-10-CM

## 2023-12-15 NOTE — Telephone Encounter (Signed)
 Prolia  VOB initiated via [email or web address]  Next Prolia  inj DUE: NEW START

## 2023-12-16 ENCOUNTER — Other Ambulatory Visit (HOSPITAL_COMMUNITY): Payer: Self-pay

## 2023-12-16 NOTE — Telephone Encounter (Addendum)
 MEDICAL PA SUBMITTED VIA LATENT. KEY: [id number]   APPROVED -     PHARMACY: [phone]

## 2023-12-16 NOTE — Telephone Encounter (Signed)
 Proceed with MTDM services  Med obtained from pharmacy and shipped to clinic:  Pharmacy benefit: Copay $64 (Paid to pharmacy) Admin Fee: $40 (Pay at clinic)  Prior Auth: N/A PA# Expiration Date:   # of doses approved:   Patient NOT eligible for Copay Card. Copay Card can make patient's cost as little as $25. Link to apply: [email or web address]://[email or web address]  ** This summary of benefits is an estimation of the patient's out-of-pocket cost. Exact cost may very based on individual plan coverage.

## 2023-12-16 NOTE — Telephone Encounter (Signed)
 Diana Martin

## 2023-12-20 ENCOUNTER — Encounter: Payer: Self-pay | Admitting: Obstetrics and Gynecology

## 2023-12-20 DIAGNOSIS — R739 Hyperglycemia, unspecified: Secondary | ICD-10-CM | POA: Diagnosis not present

## 2023-12-20 DIAGNOSIS — Z79899 Other long term (current) drug therapy: Secondary | ICD-10-CM | POA: Diagnosis not present

## 2023-12-20 MED ORDER — DENOSUMAB 60 MG/ML ~~LOC~~ SOSY: 60.0000 mg | PREFILLED_SYRINGE | SUBCUTANEOUS | 1 refills | Status: DC

## 2023-12-20 NOTE — Addendum Note (Signed)
 Addended by: Maleeka Sabatino T on: 12/20/2023 10:36 AM   Modules accepted: Orders

## 2023-12-20 NOTE — Telephone Encounter (Signed)
 Prescription for Prolia  #1, 1RF sent to [facility].

## 2023-12-21 ENCOUNTER — Other Ambulatory Visit: Payer: Self-pay

## 2023-12-21 NOTE — Progress Notes (Signed)
 Patient agreeable to $64 copay and payment information has been collected. Patient aware she will need to speak with pharmacist before medication can be dispensed. MTDM visit scheduled. Will finish initial onboarding documentation once visit with Jen is complete.

## 2023-12-21 NOTE — Progress Notes (Signed)
 Attempted to contact patient to discuss Prolia  copay- LVM. Will need MTDM visit.

## 2023-12-22 ENCOUNTER — Ambulatory Visit: Attending: Obstetrics and Gynecology

## 2023-12-22 ENCOUNTER — Other Ambulatory Visit: Payer: Self-pay

## 2023-12-22 DIAGNOSIS — Z79899 Other long term (current) drug therapy: Secondary | ICD-10-CM

## 2023-12-22 DIAGNOSIS — M810 Age-related osteoporosis without current pathological fracture: Secondary | ICD-10-CM

## 2023-12-22 MED ORDER — DENOSUMAB 60 MG/ML ~~LOC~~ SOSY
60.0000 mg | PREFILLED_SYRINGE | SUBCUTANEOUS | 1 refills | Status: AC
  Filled 2023-12-22: qty 1, 180d supply, fill #0

## 2023-12-22 NOTE — Progress Notes (Signed)
 Specialty Pharmacy Initial Fill Coordination Note  Diana Martin is a 78 y.o. female contacted today regarding initial fill of specialty medication(s) Denosumab  (PROLIA )   Patient requested Courier to Provider Office   Delivery date: 12/27/23   Verified address: [facility]  [address] [phone]   Medication will be filled on: 12/26/23   Patient is aware of $64 copayment.

## 2023-12-22 NOTE — Progress Notes (Signed)
 [facility]  Referring Provider: Bobie Cathlyn Cary  Virtual Visit via Telephone Note  I connected with Diana Martin on 12/22/2023 at  8:30 AM EST by telephone and verified that I am speaking with the correct person using two identifiers.  Location: Patient: home  Provider: office   I discussed the limitations, risks, security and privacy concerns of performing an evaluation and management service by telephone and the availability of in person appointments. I also discussed with the patient that there may be a patient responsible charge related to this service. The patient expressed understanding and agreed to proceed.   HPI: Diana Martin is a 78 y.o. female who presents to the pharmacotherapy clinic via telephone for initial counseling for Prolia  for osteoporosis`.  Patient Active Problem List   Diagnosis Date Noted   S/P left THA, AA 04/12/2017   Pain of left hip joint 03/31/2017   Osteoarthritis of left hip 03/31/2017   Closed fracture of left distal radius 07/20/2016   S/P right THA, AA 05/25/2016   Osteopenia 03/11/2015   Postmenopausal atrophic vaginitis 03/11/2015   SPRAIN AND STRAIN OF RIBS 01/09/2008   ANKLE INJURY, LEFT 01/09/2008   BREAST CANCER, HX OF 09/06/2006    Patient's Medications  New Prescriptions   No medications on file  Previous Medications   ALENDRONATE  (FOSAMAX ) 70 MG TABLET    Take 1 tablet (70 mg total) by mouth every 7 (seven) days. Take with a full glass of water  on an empty stomach.   CALCIUM -VITAMIN D  (OSCAL WITH D) 500-200 MG-UNIT PER TABLET    Take 1 tablet by mouth daily.   DENOSUMAB  (PROLIA ) 60 MG/ML SOSY INJECTION    Inject 60 mg into the skin every 6 (six) months.   MULTIPLE VITAMIN (MULTIVITAMIN) TABLET    Take 1 tablet by mouth daily.   ROSUVASTATIN (CRESTOR) 5 MG TABLET    Take 5 mg by mouth daily.  Modified Medications   No medications on file  Discontinued Medications   No medications on file     Allergies: Allergies[1]  Past Medical History: Past Medical History:  Diagnosis Date   Arthritis    Atrophic vaginitis    Breast cancer (HCC)    Broken wrist    left   Hyperlipidemia    Osteoporosis 10/02/2021   T score of radius:  -3.8   Vitamin D  deficiency     Social History: Social History   Socioeconomic History   Marital status: Married    Spouse name: Not on file   Number of children: 0   Years of education: Not on file   Highest education level: Not on file  Occupational History   Not on file  Tobacco Use   Smoking status: Never   Smokeless tobacco: Never  Vaping Use   Vaping status: Never Used  Substance and Sexual Activity   Alcohol use: Yes    Alcohol/week: 7.0 standard drinks of alcohol    Types: 7 Glasses of wine per week   Drug use: No   Sexual activity: Not Currently    Partners: Male    Birth control/protection: Post-menopausal    Comment: After 17, less than 5, no abnormal pap, no std, no DES, hx of breast cancer - 20 years ago.  Other Topics Concern   Not on file  Social History Narrative   Not on file   Social Drivers of Health   Tobacco Use: Low Risk (12/06/2023)   Patient History  Smoking Tobacco Use: Never    Smokeless Tobacco Use: Never    Passive Exposure: Not on file  Financial Resource Strain: Not on file  Food Insecurity: Not on file  Transportation Needs: Not on file  Physical Activity: Not on file  Stress: Not on file  Social Connections: Not on file  Depression (EYV7-0): Not on file  Alcohol Screen: Not on file  Housing: Not on file  Utilities: Not on file  Health Literacy: Not on file    Medication: Prolia  (denosumab )  Assessment: Patient history, labs and DEXA assessed and initiation of Prolia  is appropriate. Patient will discontinue Fosamax .  Vitamin D  Lab Results  Component Value Date   VD25OH 66 08/30/2023    Calcium  Lab Results  Component Value Date   CALCIUM  9.7 08/30/2023    DEXA scan,  most current DEXA scan T-score is minus 3.8 (10/21/23).  Plan:  -Counseled patient on purpose, proper use, and adverse effects of Prolia  (denosumab ).  Counseled patient that medication must be injected every 6 months by a healthcare professional.  Advised patient to take calcium  1200 mg daily and vitamin D  800 units daily.  Reviewed the most common adverse effects including risk of infection, osteonecrosis of the jaw, rash, and muscle/bone pain.  Patient confirms she does not have any major dental work planned at this time.  Reviewed with patient the signs/symptoms of low calcium  and advised patient to alert us  if she experiences these symptoms.  - Patient will be scheduled for new start Prolia  (denosumab ) appointment at [facility] when medication arrives to office.  - Rx will be triaged to [facility] for courier to [facility].   I discussed the assessment and treatment plan with the patient. The patient was provided an opportunity to ask questions and all were answered. The patient agreed with the plan and demonstrated an understanding of the instructions.   The patient was advised to call back or seek an in-person evaluation if the symptoms worsen or if the condition fails to improve as anticipated.  I provided 15 minutes of non-face-to-face time during this encounter.  Delon Brow, PharmD, CSP, AAHIVP, CPP Clinical Pharmacist Practitioner - Medication Therapy Disease Management/Specialty Pharmacy Services 12/22/2023, 8:54 AM       [1] No Known Allergies

## 2023-12-26 ENCOUNTER — Other Ambulatory Visit: Payer: Self-pay

## 2023-12-28 ENCOUNTER — Ambulatory Visit

## 2023-12-28 DIAGNOSIS — M810 Age-related osteoporosis without current pathological fracture: Secondary | ICD-10-CM

## 2023-12-28 NOTE — Progress Notes (Addendum)
 Prolia  given SQ right arm.  Patient tolerated injection well.  Annual exam: 08/30/23 BS  Calcium :  9.7          Date: 08/30/23  Upcoming dental procedures: No   Hx of Kidney Disease: No   Last Bone Density Scan: 10/10/2

## 2024-01-10 ENCOUNTER — Other Ambulatory Visit (HOSPITAL_COMMUNITY): Payer: Self-pay

## 2024-08-30 ENCOUNTER — Ambulatory Visit: Admitting: Obstetrics and Gynecology
